# Patient Record
Sex: Male | Born: 1935 | Race: White | Hispanic: No | Marital: Married | Smoking: Former smoker
Health system: Southern US, Community
[De-identification: ages and names within clinical notes are randomized; demographics above are authoritative.]

## PROBLEM LIST (undated history)

## (undated) DIAGNOSIS — E7800 Pure hypercholesterolemia, unspecified: Secondary | ICD-10-CM

## (undated) DIAGNOSIS — K219 Gastro-esophageal reflux disease without esophagitis: Secondary | ICD-10-CM

## (undated) DIAGNOSIS — E663 Overweight: Secondary | ICD-10-CM

## (undated) DIAGNOSIS — R270 Ataxia, unspecified: Secondary | ICD-10-CM

## (undated) DIAGNOSIS — Z8546 Personal history of malignant neoplasm of prostate: Secondary | ICD-10-CM

## (undated) DIAGNOSIS — D638 Anemia in other chronic diseases classified elsewhere: Secondary | ICD-10-CM

## (undated) DIAGNOSIS — I10 Essential (primary) hypertension: Secondary | ICD-10-CM

## (undated) DIAGNOSIS — M1990 Unspecified osteoarthritis, unspecified site: Secondary | ICD-10-CM

## (undated) HISTORY — PX: CATARACT EXTRACTION: SUR2

## (undated) HISTORY — DX: Gastro-esophageal reflux disease without esophagitis: K21.9

## (undated) HISTORY — DX: Anemia in other chronic diseases classified elsewhere: D63.8

## (undated) HISTORY — DX: Personal history of malignant neoplasm of prostate: Z85.46

## (undated) HISTORY — DX: Pure hypercholesterolemia, unspecified: E78.00

## (undated) HISTORY — DX: Unspecified osteoarthritis, unspecified site: M19.90

## (undated) HISTORY — PX: PROSTATE SURGERY: SHX751

## (undated) HISTORY — DX: Essential (primary) hypertension: I10

## (undated) HISTORY — DX: Overweight: E66.3

## (undated) HISTORY — DX: Ataxia, unspecified: R27.0

---

## 1996-02-23 HISTORY — PX: TOTAL HIP ARTHROPLASTY: SHX124

## 2006-08-03 HISTORY — PX: INGUINAL HERNIA REPAIR: SUR1180

## 2007-05-11 ENCOUNTER — Inpatient Hospital Stay (HOSPITAL_COMMUNITY): Admission: RE | Admit: 2007-05-11 | Discharge: 2007-05-13 | Payer: Self-pay | Admitting: Orthopedic Surgery

## 2008-08-01 IMAGING — CR DG PORTABLE PELVIS
1 series · 1 of 1 positions shown · non-contrast
Comparison: None.

CLINICAL DATA: Status post right hip replacement.

PORTABLE PELVIS

[view not recorded]
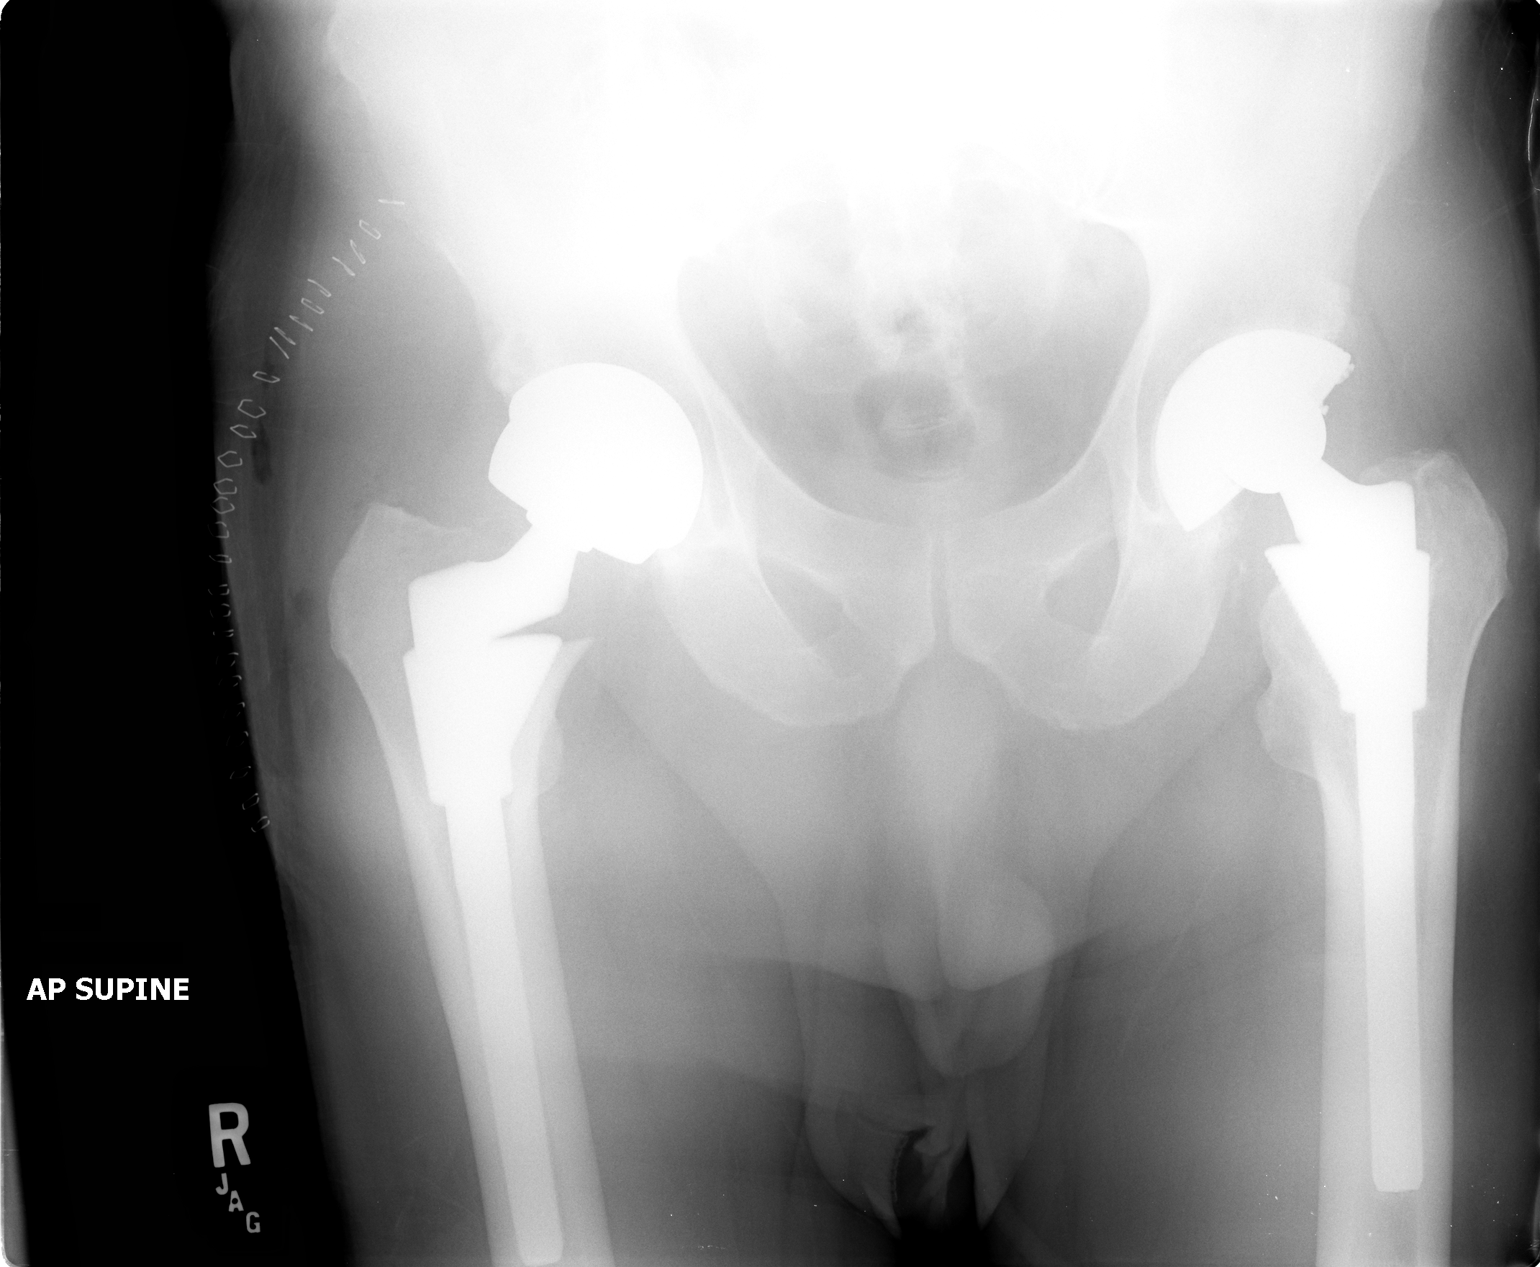

[1 of 1 positions shown; findings below may reference images not displayed]

FINDINGS: Status post bilateral hip replacements with the recent
hip replacement on the right.  This appears in satisfactory
position.  The tip is barely visualized on the present exam.
IMPRESSION: Status post bilateral hip replacement appearing in satisfactory
position.

## 2009-06-19 ENCOUNTER — Ambulatory Visit (HOSPITAL_BASED_OUTPATIENT_CLINIC_OR_DEPARTMENT_OTHER): Admission: RE | Admit: 2009-06-19 | Discharge: 2009-06-19 | Payer: Self-pay | Admitting: Urology

## 2009-07-09 ENCOUNTER — Ambulatory Visit: Admission: RE | Admit: 2009-07-09 | Discharge: 2009-10-16 | Payer: Self-pay | Admitting: Radiation Oncology

## 2009-11-27 ENCOUNTER — Ambulatory Visit: Admission: RE | Admit: 2009-11-27 | Discharge: 2009-12-02 | Payer: Self-pay | Admitting: Radiation Oncology

## 2010-04-07 LAB — GLUCOSE, CAPILLARY: Glucose-Capillary: 105 mg/dL — ABNORMAL HIGH (ref 70–99)

## 2010-06-03 NOTE — Op Note (Signed)
NAME:  Jack Turner, Jack Turner NO.:  [id number]   MEDICAL RECORD NO.:  [id number]          PATIENT TYPE:  INP   LOCATION:  5028                         FACILITY:  [facility]   PHYSICIAN:  Harvie Junior, M.D.   DATE OF BIRTH:  May 24, 1931   DATE OF PROCEDURE:  05/11/2007  DATE OF DISCHARGE:                               OPERATIVE REPORT   PREOPERATIVE DIAGNOSIS:  End-stage degenerative joint disease right hip.   POSTOPERATIVE DIAGNOSIS:  End-stage degenerative joint disease right  hip.   OPERATIONS AND PROCEDURE:  Right total hip replacement with a S-ROM  implant  18 x 13 stem, 36 +12 offset with a 18 F large cone with a 56  ASR cup and a 49 +3  ASR hip ball.   SURGEON:  Harvie Junior, M.D.   ASSISTANT:  Marshia Ly, P.A.   ANESTHESIA:  General.   BRIEF HISTORY:  Jack Turner is a 76 year old gentleman with a long  history of having had significant pain in his right hip.  We have worked  him up thoroughly related to his back, related to his hip.  We did an  intraarticular hip injection which helped quite a lot.  We did some  epidural steroids which helped but did not change his hip pain and  ultimately felt that we were satisfied that the problem was coming from  his hip and it has actually certainly showed end-stage degenerative  joint disease.  He was brought to the operating room for right total hip  replacement.   PROCEDURE:  The patient was brought to the operating room.  After  adequate anesthesia was obtained under general anesthetic, the patient  placed in the left lateral decubitus position.  All bony prominences  were well padded.  Attention was then turned to the right hip where  after routine prep and drape, the incision was made over the posterior  aspect of the hip, subcutaneous tissues taken at down the level of the  IT band.  The IT band was then divided as well as the gluteus maximus  __________ fractured.  A __________ retractor was put in place.   The hip  was then internally rotated and retractors were put in above the  piriformis and below the hip capsule and the piriformis and the capsule  and short external rotators were taken down and tagged.  Following this,  a labrectomy was performed and the retractor was put in place anterior  to the acetabulum and in the condyloid fossa.  At this point, the  acetabulum sequentially reamed to a level of 55 and a 56 cup was put in  place after trial on 54 and 56, 20 degrees of anteversion on the cup, 45  degrees of lateral opening and the cup was excellently placed at this  point.  Low anterior osteophyte needed to be removed at this point off  of the acetabulum, this was performed.  Following this, attention was  turned towards the stem, where the canal was found and then sequentially  reamed up to a level of 13.  A 13  __________  reamer was then advanced  three-quarters of the way down the femur.  Attention was then turned up  to the stem where an 87 F large cone with a large ball was chosen and  then this trial was put in place.  The hip was then reduced in neutral  version relative to the spout and this actually turned out to be almost  no version relative to the condylar axis and at this point, we felt that  dislocation was really ready.  At that point, we put 30 degrees of  anteversion relative to the condylar axis and then went back with a +0  ball, and actually felt pretty good.  We went to a +3 ball to try to  match his anatomy from his preoperative templating and x-rays and this  actually was a very stable hip.  At this point, the trials were removed.  The final F stem implant was put in place 36 +12 offset with 30 degrees  of anteversion relative to the cone which was about 30 degrees of  anteversion relative to the condylar axis.  The +3 hip ball was then  used 49 to match a 56 ASR cup and the trial reduction was undertaken and  this went excellently.  The hip was then put  through a range of motion,  a very stable hip at this point and the length appeared equal and  symmetric on the table.  At this point, the wound was copiously and  thoroughly irrigated.  Short external rotators, posterior capsule, and  piriformis were repaired to the intertrochanteric line posteriorly and  once this was completed, the wound was copiously and thoroughly  irrigated, and suctioned dry.  __________  fascia was then closed with 1  Vicryl, running subcu with 0 and 2-0 Vicryl, and skin with staples.  A  sterile compression dressing was applied.  The patient was taken to the  recovery room and was noted to be in satisfactory condition.   ESTIMATED BLOOD LOSS:  Less than 300 mL.   __________ .      Harvie Junior, M.D.  Electronically Signed     JLG/MEDQ  D:  05/11/2007  T:  05/12/2007  Job:  [id number]

## 2010-10-14 LAB — BASIC METABOLIC PANEL
BUN: 17
Chloride: 107
Sodium: 141

## 2010-10-14 LAB — TYPE AND SCREEN
ABO/RH(D): A POS
Antibody Screen: NEGATIVE

## 2010-10-14 LAB — PROTIME-INR
INR: 1
INR: 1.2
Prothrombin Time: 13.6
Prothrombin Time: 15.3 — ABNORMAL HIGH
Prothrombin Time: 21.7 — ABNORMAL HIGH

## 2010-10-14 LAB — URINALYSIS, ROUTINE W REFLEX MICROSCOPIC
Bilirubin Urine: NEGATIVE
Glucose, UA: NEGATIVE
Protein, ur: NEGATIVE
Specific Gravity, Urine: 1.018
Urobilinogen, UA: 0.2

## 2010-10-14 LAB — CBC
HCT: 41.3
Hemoglobin: 10 — ABNORMAL LOW
Hemoglobin: 10.8 — ABNORMAL LOW
Hemoglobin: 14.3
MCHC: 34.3
MCHC: 35.3
MCV: 92.2
MCV: 93
Platelets: 194
RBC: 3.14 — ABNORMAL LOW
RBC: 3.31 — ABNORMAL LOW
RBC: 4.44
RDW: 13.4
WBC: 12.1 — ABNORMAL HIGH
WBC: 8.8

## 2010-10-14 LAB — DIFFERENTIAL
Basophils Relative: 0
Eosinophils Relative: 2
Lymphocytes Relative: 14
Monocytes Absolute: 0.5
Monocytes Relative: 6
Neutro Abs: 6.8

## 2010-10-14 LAB — APTT: aPTT: 31

## 2010-10-14 LAB — URINE MICROSCOPIC-ADD ON

## 2010-10-14 LAB — COMPREHENSIVE METABOLIC PANEL
Albumin: 4.1
Calcium: 8.9
Chloride: 105
GFR calc non Af Amer: >60
Glucose, Bld: 99
Total Bilirubin: 0.8

## 2011-02-16 DIAGNOSIS — H4720 Unspecified optic atrophy: Secondary | ICD-10-CM | POA: Diagnosis not present

## 2011-02-16 DIAGNOSIS — H43819 Vitreous degeneration, unspecified eye: Secondary | ICD-10-CM | POA: Diagnosis not present

## 2011-02-16 DIAGNOSIS — H34239 Retinal artery branch occlusion, unspecified eye: Secondary | ICD-10-CM | POA: Diagnosis not present

## 2011-04-02 DIAGNOSIS — H524 Presbyopia: Secondary | ICD-10-CM | POA: Diagnosis not present

## 2011-04-02 DIAGNOSIS — H26499 Other secondary cataract, unspecified eye: Secondary | ICD-10-CM | POA: Diagnosis not present

## 2011-06-29 DIAGNOSIS — C61 Malignant neoplasm of prostate: Secondary | ICD-10-CM | POA: Diagnosis not present

## 2011-07-13 DIAGNOSIS — I10 Essential (primary) hypertension: Secondary | ICD-10-CM | POA: Diagnosis not present

## 2011-07-13 DIAGNOSIS — D2330 Other benign neoplasm of skin of unspecified part of face: Secondary | ICD-10-CM | POA: Diagnosis not present

## 2011-07-13 DIAGNOSIS — E7800 Pure hypercholesterolemia, unspecified: Secondary | ICD-10-CM | POA: Diagnosis not present

## 2011-09-10 DIAGNOSIS — R310 Gross hematuria: Secondary | ICD-10-CM | POA: Diagnosis not present

## 2011-09-10 DIAGNOSIS — C61 Malignant neoplasm of prostate: Secondary | ICD-10-CM | POA: Diagnosis not present

## 2011-09-16 DIAGNOSIS — R310 Gross hematuria: Secondary | ICD-10-CM | POA: Diagnosis not present

## 2011-09-16 DIAGNOSIS — Q618 Other cystic kidney diseases: Secondary | ICD-10-CM | POA: Diagnosis not present

## 2011-09-29 DIAGNOSIS — C61 Malignant neoplasm of prostate: Secondary | ICD-10-CM | POA: Diagnosis not present

## 2011-09-29 DIAGNOSIS — R310 Gross hematuria: Secondary | ICD-10-CM | POA: Diagnosis not present

## 2011-10-05 DIAGNOSIS — Z23 Encounter for immunization: Secondary | ICD-10-CM | POA: Diagnosis not present

## 2011-10-23 DIAGNOSIS — H33309 Unspecified retinal break, unspecified eye: Secondary | ICD-10-CM | POA: Diagnosis not present

## 2011-10-23 DIAGNOSIS — H4720 Unspecified optic atrophy: Secondary | ICD-10-CM | POA: Diagnosis not present

## 2011-10-23 DIAGNOSIS — H33319 Horseshoe tear of retina without detachment, unspecified eye: Secondary | ICD-10-CM | POA: Diagnosis not present

## 2011-10-26 DIAGNOSIS — M25559 Pain in unspecified hip: Secondary | ICD-10-CM | POA: Diagnosis not present

## 2011-10-26 DIAGNOSIS — M48061 Spinal stenosis, lumbar region without neurogenic claudication: Secondary | ICD-10-CM | POA: Diagnosis not present

## 2011-10-26 DIAGNOSIS — M545 Low back pain: Secondary | ICD-10-CM | POA: Diagnosis not present

## 2011-11-06 DIAGNOSIS — M47817 Spondylosis without myelopathy or radiculopathy, lumbosacral region: Secondary | ICD-10-CM | POA: Diagnosis not present

## 2011-11-12 DIAGNOSIS — M48061 Spinal stenosis, lumbar region without neurogenic claudication: Secondary | ICD-10-CM | POA: Diagnosis not present

## 2011-11-20 DIAGNOSIS — I10 Essential (primary) hypertension: Secondary | ICD-10-CM | POA: Diagnosis not present

## 2011-11-20 DIAGNOSIS — M542 Cervicalgia: Secondary | ICD-10-CM | POA: Diagnosis not present

## 2011-11-23 DIAGNOSIS — M48061 Spinal stenosis, lumbar region without neurogenic claudication: Secondary | ICD-10-CM | POA: Diagnosis not present

## 2011-12-08 DIAGNOSIS — M48061 Spinal stenosis, lumbar region without neurogenic claudication: Secondary | ICD-10-CM | POA: Diagnosis not present

## 2011-12-30 DIAGNOSIS — R351 Nocturia: Secondary | ICD-10-CM | POA: Diagnosis not present

## 2011-12-30 DIAGNOSIS — C61 Malignant neoplasm of prostate: Secondary | ICD-10-CM | POA: Diagnosis not present

## 2012-01-01 DIAGNOSIS — M48061 Spinal stenosis, lumbar region without neurogenic claudication: Secondary | ICD-10-CM | POA: Diagnosis not present

## 2012-02-11 DIAGNOSIS — E7800 Pure hypercholesterolemia, unspecified: Secondary | ICD-10-CM | POA: Diagnosis not present

## 2012-02-11 DIAGNOSIS — I10 Essential (primary) hypertension: Secondary | ICD-10-CM | POA: Diagnosis not present

## 2012-02-11 DIAGNOSIS — J45909 Unspecified asthma, uncomplicated: Secondary | ICD-10-CM | POA: Diagnosis not present

## 2012-02-11 DIAGNOSIS — D539 Nutritional anemia, unspecified: Secondary | ICD-10-CM | POA: Diagnosis not present

## 2012-03-24 DIAGNOSIS — M48061 Spinal stenosis, lumbar region without neurogenic claudication: Secondary | ICD-10-CM | POA: Diagnosis not present

## 2012-04-25 DIAGNOSIS — H4720 Unspecified optic atrophy: Secondary | ICD-10-CM | POA: Diagnosis not present

## 2012-04-25 DIAGNOSIS — H33309 Unspecified retinal break, unspecified eye: Secondary | ICD-10-CM | POA: Diagnosis not present

## 2012-04-25 DIAGNOSIS — H43819 Vitreous degeneration, unspecified eye: Secondary | ICD-10-CM | POA: Diagnosis not present

## 2012-07-11 DIAGNOSIS — C61 Malignant neoplasm of prostate: Secondary | ICD-10-CM | POA: Diagnosis not present

## 2012-07-11 DIAGNOSIS — N3289 Other specified disorders of bladder: Secondary | ICD-10-CM | POA: Diagnosis not present

## 2012-08-29 DIAGNOSIS — I10 Essential (primary) hypertension: Secondary | ICD-10-CM | POA: Diagnosis not present

## 2012-08-29 DIAGNOSIS — Z0000 Encounter for general adult medical examination without abnormal findings: Secondary | ICD-10-CM | POA: Diagnosis not present

## 2012-08-29 DIAGNOSIS — E7800 Pure hypercholesterolemia, unspecified: Secondary | ICD-10-CM | POA: Diagnosis not present

## 2012-10-11 DIAGNOSIS — Z23 Encounter for immunization: Secondary | ICD-10-CM | POA: Diagnosis not present

## 2012-10-24 DIAGNOSIS — H4011X0 Primary open-angle glaucoma, stage unspecified: Secondary | ICD-10-CM | POA: Diagnosis not present

## 2012-10-24 DIAGNOSIS — H4720 Unspecified optic atrophy: Secondary | ICD-10-CM | POA: Diagnosis not present

## 2012-10-24 DIAGNOSIS — H33319 Horseshoe tear of retina without detachment, unspecified eye: Secondary | ICD-10-CM | POA: Diagnosis not present

## 2012-10-24 DIAGNOSIS — H43819 Vitreous degeneration, unspecified eye: Secondary | ICD-10-CM | POA: Diagnosis not present

## 2012-10-24 DIAGNOSIS — H31009 Unspecified chorioretinal scars, unspecified eye: Secondary | ICD-10-CM | POA: Diagnosis not present

## 2012-12-22 DIAGNOSIS — IMO0002 Reserved for concepts with insufficient information to code with codable children: Secondary | ICD-10-CM | POA: Diagnosis not present

## 2013-01-09 DIAGNOSIS — N3289 Other specified disorders of bladder: Secondary | ICD-10-CM | POA: Diagnosis not present

## 2013-01-09 DIAGNOSIS — C61 Malignant neoplasm of prostate: Secondary | ICD-10-CM | POA: Diagnosis not present

## 2013-03-15 DIAGNOSIS — I10 Essential (primary) hypertension: Secondary | ICD-10-CM | POA: Diagnosis not present

## 2013-03-15 DIAGNOSIS — M1990 Unspecified osteoarthritis, unspecified site: Secondary | ICD-10-CM | POA: Diagnosis not present

## 2013-03-15 DIAGNOSIS — E7800 Pure hypercholesterolemia, unspecified: Secondary | ICD-10-CM | POA: Diagnosis not present

## 2013-03-15 DIAGNOSIS — J40 Bronchitis, not specified as acute or chronic: Secondary | ICD-10-CM | POA: Diagnosis not present

## 2013-03-22 DIAGNOSIS — N183 Chronic kidney disease, stage 3 unspecified: Secondary | ICD-10-CM | POA: Diagnosis not present

## 2013-04-24 DIAGNOSIS — H3410 Central retinal artery occlusion, unspecified eye: Secondary | ICD-10-CM | POA: Diagnosis not present

## 2013-04-24 DIAGNOSIS — H4011X0 Primary open-angle glaucoma, stage unspecified: Secondary | ICD-10-CM | POA: Diagnosis not present

## 2013-04-24 DIAGNOSIS — H43819 Vitreous degeneration, unspecified eye: Secondary | ICD-10-CM | POA: Diagnosis not present

## 2013-05-04 DIAGNOSIS — M47817 Spondylosis without myelopathy or radiculopathy, lumbosacral region: Secondary | ICD-10-CM | POA: Diagnosis not present

## 2013-08-02 DIAGNOSIS — D1739 Benign lipomatous neoplasm of skin and subcutaneous tissue of other sites: Secondary | ICD-10-CM | POA: Diagnosis not present

## 2013-08-02 DIAGNOSIS — T148 Other injury of unspecified body region: Secondary | ICD-10-CM | POA: Diagnosis not present

## 2013-08-15 DIAGNOSIS — C61 Malignant neoplasm of prostate: Secondary | ICD-10-CM | POA: Diagnosis not present

## 2013-08-31 DIAGNOSIS — M47817 Spondylosis without myelopathy or radiculopathy, lumbosacral region: Secondary | ICD-10-CM | POA: Diagnosis not present

## 2013-09-11 DIAGNOSIS — I10 Essential (primary) hypertension: Secondary | ICD-10-CM | POA: Diagnosis not present

## 2013-09-11 DIAGNOSIS — Z0000 Encounter for general adult medical examination without abnormal findings: Secondary | ICD-10-CM | POA: Diagnosis not present

## 2013-09-11 DIAGNOSIS — Z23 Encounter for immunization: Secondary | ICD-10-CM | POA: Diagnosis not present

## 2013-09-11 DIAGNOSIS — J45909 Unspecified asthma, uncomplicated: Secondary | ICD-10-CM | POA: Diagnosis not present

## 2013-10-11 DIAGNOSIS — Z23 Encounter for immunization: Secondary | ICD-10-CM | POA: Diagnosis not present

## 2013-10-23 DIAGNOSIS — H4011X2 Primary open-angle glaucoma, moderate stage: Secondary | ICD-10-CM | POA: Diagnosis not present

## 2013-10-23 DIAGNOSIS — H3412 Central retinal artery occlusion, left eye: Secondary | ICD-10-CM | POA: Diagnosis not present

## 2013-10-23 DIAGNOSIS — H4720 Unspecified optic atrophy: Secondary | ICD-10-CM | POA: Diagnosis not present

## 2014-02-15 DIAGNOSIS — N3289 Other specified disorders of bladder: Secondary | ICD-10-CM | POA: Diagnosis not present

## 2014-02-15 DIAGNOSIS — C61 Malignant neoplasm of prostate: Secondary | ICD-10-CM | POA: Diagnosis not present

## 2014-03-14 DIAGNOSIS — L84 Corns and callosities: Secondary | ICD-10-CM | POA: Diagnosis not present

## 2014-03-14 DIAGNOSIS — T485X1A Poisoning by other anti-common-cold drugs, accidental (unintentional), initial encounter: Secondary | ICD-10-CM | POA: Diagnosis not present

## 2014-03-14 DIAGNOSIS — J310 Chronic rhinitis: Secondary | ICD-10-CM | POA: Diagnosis not present

## 2014-03-14 DIAGNOSIS — J329 Chronic sinusitis, unspecified: Secondary | ICD-10-CM | POA: Diagnosis not present

## 2014-03-30 DIAGNOSIS — D509 Iron deficiency anemia, unspecified: Secondary | ICD-10-CM | POA: Diagnosis not present

## 2014-03-30 DIAGNOSIS — M1990 Unspecified osteoarthritis, unspecified site: Secondary | ICD-10-CM | POA: Diagnosis not present

## 2014-03-30 DIAGNOSIS — Z1389 Encounter for screening for other disorder: Secondary | ICD-10-CM | POA: Diagnosis not present

## 2014-03-30 DIAGNOSIS — Z79899 Other long term (current) drug therapy: Secondary | ICD-10-CM | POA: Diagnosis not present

## 2014-03-30 DIAGNOSIS — I10 Essential (primary) hypertension: Secondary | ICD-10-CM | POA: Diagnosis not present

## 2014-03-30 DIAGNOSIS — E780 Pure hypercholesterolemia: Secondary | ICD-10-CM | POA: Diagnosis not present

## 2014-03-30 DIAGNOSIS — J45909 Unspecified asthma, uncomplicated: Secondary | ICD-10-CM | POA: Diagnosis not present

## 2014-04-06 DIAGNOSIS — E049 Nontoxic goiter, unspecified: Secondary | ICD-10-CM | POA: Diagnosis not present

## 2014-04-06 DIAGNOSIS — E041 Nontoxic single thyroid nodule: Secondary | ICD-10-CM | POA: Diagnosis not present

## 2014-04-11 DIAGNOSIS — R899 Unspecified abnormal finding in specimens from other organs, systems and tissues: Secondary | ICD-10-CM | POA: Diagnosis not present

## 2014-04-11 DIAGNOSIS — I10 Essential (primary) hypertension: Secondary | ICD-10-CM | POA: Diagnosis not present

## 2014-05-31 DIAGNOSIS — M25551 Pain in right hip: Secondary | ICD-10-CM | POA: Diagnosis not present

## 2014-05-31 DIAGNOSIS — M25552 Pain in left hip: Secondary | ICD-10-CM | POA: Diagnosis not present

## 2014-05-31 DIAGNOSIS — M545 Low back pain: Secondary | ICD-10-CM | POA: Diagnosis not present

## 2014-06-15 DIAGNOSIS — J45909 Unspecified asthma, uncomplicated: Secondary | ICD-10-CM | POA: Diagnosis not present

## 2014-06-15 DIAGNOSIS — I10 Essential (primary) hypertension: Secondary | ICD-10-CM | POA: Diagnosis not present

## 2014-06-15 DIAGNOSIS — Z1389 Encounter for screening for other disorder: Secondary | ICD-10-CM | POA: Diagnosis not present

## 2014-06-15 DIAGNOSIS — K529 Noninfective gastroenteritis and colitis, unspecified: Secondary | ICD-10-CM | POA: Diagnosis not present

## 2014-06-28 DIAGNOSIS — M47816 Spondylosis without myelopathy or radiculopathy, lumbar region: Secondary | ICD-10-CM | POA: Diagnosis not present

## 2014-09-05 DIAGNOSIS — N3289 Other specified disorders of bladder: Secondary | ICD-10-CM | POA: Diagnosis not present

## 2014-09-05 DIAGNOSIS — C61 Malignant neoplasm of prostate: Secondary | ICD-10-CM | POA: Diagnosis not present

## 2014-10-01 DIAGNOSIS — H4720 Unspecified optic atrophy: Secondary | ICD-10-CM | POA: Diagnosis not present

## 2014-10-01 DIAGNOSIS — H3412 Central retinal artery occlusion, left eye: Secondary | ICD-10-CM | POA: Diagnosis not present

## 2014-10-01 DIAGNOSIS — H33313 Horseshoe tear of retina without detachment, bilateral: Secondary | ICD-10-CM | POA: Diagnosis not present

## 2014-10-01 DIAGNOSIS — H4011X2 Primary open-angle glaucoma, moderate stage: Secondary | ICD-10-CM | POA: Diagnosis not present

## 2014-10-08 DIAGNOSIS — Z23 Encounter for immunization: Secondary | ICD-10-CM | POA: Diagnosis not present

## 2014-10-22 DIAGNOSIS — H401130 Primary open-angle glaucoma, bilateral, stage unspecified: Secondary | ICD-10-CM | POA: Diagnosis not present

## 2014-10-22 DIAGNOSIS — H4720 Unspecified optic atrophy: Secondary | ICD-10-CM | POA: Diagnosis not present

## 2014-10-22 DIAGNOSIS — H02839 Dermatochalasis of unspecified eye, unspecified eyelid: Secondary | ICD-10-CM | POA: Diagnosis not present

## 2014-10-22 DIAGNOSIS — H4010X0 Unspecified open-angle glaucoma, stage unspecified: Secondary | ICD-10-CM | POA: Diagnosis not present

## 2014-10-22 DIAGNOSIS — H18411 Arcus senilis, right eye: Secondary | ICD-10-CM | POA: Diagnosis not present

## 2014-10-25 DIAGNOSIS — H4720 Unspecified optic atrophy: Secondary | ICD-10-CM | POA: Diagnosis not present

## 2014-10-25 DIAGNOSIS — J329 Chronic sinusitis, unspecified: Secondary | ICD-10-CM | POA: Diagnosis not present

## 2015-03-11 DIAGNOSIS — I10 Essential (primary) hypertension: Secondary | ICD-10-CM | POA: Diagnosis not present

## 2015-03-11 DIAGNOSIS — Z0000 Encounter for general adult medical examination without abnormal findings: Secondary | ICD-10-CM | POA: Diagnosis not present

## 2015-03-11 DIAGNOSIS — R938 Abnormal findings on diagnostic imaging of other specified body structures: Secondary | ICD-10-CM | POA: Diagnosis not present

## 2015-03-11 DIAGNOSIS — R202 Paresthesia of skin: Secondary | ICD-10-CM | POA: Diagnosis not present

## 2015-03-14 DIAGNOSIS — C61 Malignant neoplasm of prostate: Secondary | ICD-10-CM | POA: Diagnosis not present

## 2015-03-15 DIAGNOSIS — E041 Nontoxic single thyroid nodule: Secondary | ICD-10-CM | POA: Diagnosis not present

## 2015-04-02 DIAGNOSIS — Z9181 History of falling: Secondary | ICD-10-CM | POA: Diagnosis not present

## 2015-04-02 DIAGNOSIS — D510 Vitamin B12 deficiency anemia due to intrinsic factor deficiency: Secondary | ICD-10-CM | POA: Diagnosis not present

## 2015-04-02 DIAGNOSIS — E785 Hyperlipidemia, unspecified: Secondary | ICD-10-CM | POA: Diagnosis not present

## 2015-04-02 DIAGNOSIS — I10 Essential (primary) hypertension: Secondary | ICD-10-CM | POA: Diagnosis not present

## 2015-04-02 DIAGNOSIS — M1990 Unspecified osteoarthritis, unspecified site: Secondary | ICD-10-CM | POA: Diagnosis not present

## 2015-04-02 DIAGNOSIS — Z1389 Encounter for screening for other disorder: Secondary | ICD-10-CM | POA: Diagnosis not present

## 2015-04-02 DIAGNOSIS — Z79899 Other long term (current) drug therapy: Secondary | ICD-10-CM | POA: Diagnosis not present

## 2015-05-10 DIAGNOSIS — H527 Unspecified disorder of refraction: Secondary | ICD-10-CM | POA: Diagnosis not present

## 2015-05-10 DIAGNOSIS — H401130 Primary open-angle glaucoma, bilateral, stage unspecified: Secondary | ICD-10-CM | POA: Diagnosis not present

## 2015-05-10 DIAGNOSIS — Z961 Presence of intraocular lens: Secondary | ICD-10-CM | POA: Diagnosis not present

## 2015-05-10 DIAGNOSIS — H4720 Unspecified optic atrophy: Secondary | ICD-10-CM | POA: Diagnosis not present

## 2015-05-16 DIAGNOSIS — M47816 Spondylosis without myelopathy or radiculopathy, lumbar region: Secondary | ICD-10-CM | POA: Diagnosis not present

## 2015-06-06 DIAGNOSIS — M160 Bilateral primary osteoarthritis of hip: Secondary | ICD-10-CM | POA: Diagnosis not present

## 2015-09-20 ENCOUNTER — Encounter: Payer: Self-pay | Admitting: Orthopedic Surgery

## 2015-09-30 DIAGNOSIS — H401132 Primary open-angle glaucoma, bilateral, moderate stage: Secondary | ICD-10-CM | POA: Diagnosis not present

## 2015-09-30 DIAGNOSIS — H4720 Unspecified optic atrophy: Secondary | ICD-10-CM | POA: Diagnosis not present

## 2015-09-30 DIAGNOSIS — H3412 Central retinal artery occlusion, left eye: Secondary | ICD-10-CM | POA: Diagnosis not present

## 2015-09-30 DIAGNOSIS — H33313 Horseshoe tear of retina without detachment, bilateral: Secondary | ICD-10-CM | POA: Diagnosis not present

## 2015-10-03 DIAGNOSIS — N3289 Other specified disorders of bladder: Secondary | ICD-10-CM | POA: Diagnosis not present

## 2015-10-03 DIAGNOSIS — C61 Malignant neoplasm of prostate: Secondary | ICD-10-CM | POA: Diagnosis not present

## 2015-11-01 DIAGNOSIS — Z23 Encounter for immunization: Secondary | ICD-10-CM | POA: Diagnosis not present

## 2016-04-01 DIAGNOSIS — N3289 Other specified disorders of bladder: Secondary | ICD-10-CM | POA: Diagnosis not present

## 2016-04-01 DIAGNOSIS — C61 Malignant neoplasm of prostate: Secondary | ICD-10-CM | POA: Diagnosis not present

## 2016-04-14 DIAGNOSIS — Z0000 Encounter for general adult medical examination without abnormal findings: Secondary | ICD-10-CM | POA: Diagnosis not present

## 2016-04-14 DIAGNOSIS — Z79899 Other long term (current) drug therapy: Secondary | ICD-10-CM | POA: Diagnosis not present

## 2016-04-14 DIAGNOSIS — E785 Hyperlipidemia, unspecified: Secondary | ICD-10-CM | POA: Diagnosis not present

## 2016-04-14 DIAGNOSIS — I10 Essential (primary) hypertension: Secondary | ICD-10-CM | POA: Diagnosis not present

## 2016-04-14 DIAGNOSIS — Z1389 Encounter for screening for other disorder: Secondary | ICD-10-CM | POA: Diagnosis not present

## 2016-04-14 DIAGNOSIS — Z9181 History of falling: Secondary | ICD-10-CM | POA: Diagnosis not present

## 2016-04-14 DIAGNOSIS — D519 Vitamin B12 deficiency anemia, unspecified: Secondary | ICD-10-CM | POA: Diagnosis not present

## 2016-04-30 DIAGNOSIS — E041 Nontoxic single thyroid nodule: Secondary | ICD-10-CM | POA: Diagnosis not present

## 2016-05-07 DIAGNOSIS — M47816 Spondylosis without myelopathy or radiculopathy, lumbar region: Secondary | ICD-10-CM | POA: Diagnosis not present

## 2016-05-14 DIAGNOSIS — H4720 Unspecified optic atrophy: Secondary | ICD-10-CM | POA: Diagnosis not present

## 2016-05-14 DIAGNOSIS — H401131 Primary open-angle glaucoma, bilateral, mild stage: Secondary | ICD-10-CM | POA: Diagnosis not present

## 2016-05-14 DIAGNOSIS — Z961 Presence of intraocular lens: Secondary | ICD-10-CM | POA: Diagnosis not present

## 2016-09-28 DIAGNOSIS — H33301 Unspecified retinal break, right eye: Secondary | ICD-10-CM | POA: Diagnosis not present

## 2016-09-28 DIAGNOSIS — H3412 Central retinal artery occlusion, left eye: Secondary | ICD-10-CM | POA: Diagnosis not present

## 2016-09-28 DIAGNOSIS — H4720 Unspecified optic atrophy: Secondary | ICD-10-CM | POA: Diagnosis not present

## 2016-09-28 DIAGNOSIS — H43811 Vitreous degeneration, right eye: Secondary | ICD-10-CM | POA: Diagnosis not present

## 2016-09-28 DIAGNOSIS — H33313 Horseshoe tear of retina without detachment, bilateral: Secondary | ICD-10-CM | POA: Diagnosis not present

## 2016-09-30 DIAGNOSIS — Z23 Encounter for immunization: Secondary | ICD-10-CM | POA: Diagnosis not present

## 2016-10-05 DIAGNOSIS — C61 Malignant neoplasm of prostate: Secondary | ICD-10-CM | POA: Diagnosis not present

## 2016-11-05 DIAGNOSIS — Z79899 Other long term (current) drug therapy: Secondary | ICD-10-CM | POA: Diagnosis not present

## 2016-11-05 DIAGNOSIS — E785 Hyperlipidemia, unspecified: Secondary | ICD-10-CM | POA: Diagnosis not present

## 2016-11-05 DIAGNOSIS — R140 Abdominal distension (gaseous): Secondary | ICD-10-CM | POA: Diagnosis not present

## 2016-11-05 DIAGNOSIS — I10 Essential (primary) hypertension: Secondary | ICD-10-CM | POA: Diagnosis not present

## 2016-11-05 DIAGNOSIS — Z6827 Body mass index (BMI) 27.0-27.9, adult: Secondary | ICD-10-CM | POA: Diagnosis not present

## 2016-11-23 DIAGNOSIS — H401132 Primary open-angle glaucoma, bilateral, moderate stage: Secondary | ICD-10-CM | POA: Diagnosis not present

## 2016-12-30 DIAGNOSIS — Z6826 Body mass index (BMI) 26.0-26.9, adult: Secondary | ICD-10-CM | POA: Diagnosis not present

## 2016-12-30 DIAGNOSIS — K529 Noninfective gastroenteritis and colitis, unspecified: Secondary | ICD-10-CM | POA: Diagnosis not present

## 2016-12-31 DIAGNOSIS — K529 Noninfective gastroenteritis and colitis, unspecified: Secondary | ICD-10-CM | POA: Diagnosis not present

## 2017-01-05 DIAGNOSIS — K529 Noninfective gastroenteritis and colitis, unspecified: Secondary | ICD-10-CM | POA: Diagnosis not present

## 2017-04-05 DIAGNOSIS — C61 Malignant neoplasm of prostate: Secondary | ICD-10-CM | POA: Diagnosis not present

## 2017-04-05 DIAGNOSIS — R351 Nocturia: Secondary | ICD-10-CM | POA: Diagnosis not present

## 2017-04-29 DIAGNOSIS — I10 Essential (primary) hypertension: Secondary | ICD-10-CM | POA: Diagnosis not present

## 2017-04-29 DIAGNOSIS — Z1331 Encounter for screening for depression: Secondary | ICD-10-CM | POA: Diagnosis not present

## 2017-04-29 DIAGNOSIS — R5383 Other fatigue: Secondary | ICD-10-CM | POA: Diagnosis not present

## 2017-04-29 DIAGNOSIS — E785 Hyperlipidemia, unspecified: Secondary | ICD-10-CM | POA: Diagnosis not present

## 2017-04-29 DIAGNOSIS — Z79899 Other long term (current) drug therapy: Secondary | ICD-10-CM | POA: Diagnosis not present

## 2017-04-29 DIAGNOSIS — Z0000 Encounter for general adult medical examination without abnormal findings: Secondary | ICD-10-CM | POA: Diagnosis not present

## 2017-04-29 DIAGNOSIS — Z6827 Body mass index (BMI) 27.0-27.9, adult: Secondary | ICD-10-CM | POA: Diagnosis not present

## 2017-04-29 DIAGNOSIS — Z9181 History of falling: Secondary | ICD-10-CM | POA: Diagnosis not present

## 2017-05-06 DIAGNOSIS — E876 Hypokalemia: Secondary | ICD-10-CM | POA: Diagnosis not present

## 2017-05-13 DIAGNOSIS — M47816 Spondylosis without myelopathy or radiculopathy, lumbar region: Secondary | ICD-10-CM | POA: Diagnosis not present

## 2017-05-24 DIAGNOSIS — Z96642 Presence of left artificial hip joint: Secondary | ICD-10-CM | POA: Diagnosis not present

## 2017-05-24 DIAGNOSIS — M25551 Pain in right hip: Secondary | ICD-10-CM | POA: Diagnosis not present

## 2017-05-24 DIAGNOSIS — M25552 Pain in left hip: Secondary | ICD-10-CM | POA: Diagnosis not present

## 2017-05-24 DIAGNOSIS — Z96641 Presence of right artificial hip joint: Secondary | ICD-10-CM | POA: Diagnosis not present

## 2017-05-31 DIAGNOSIS — H401132 Primary open-angle glaucoma, bilateral, moderate stage: Secondary | ICD-10-CM | POA: Diagnosis not present

## 2017-06-11 DIAGNOSIS — H9190 Unspecified hearing loss, unspecified ear: Secondary | ICD-10-CM | POA: Diagnosis not present

## 2017-06-11 DIAGNOSIS — H6123 Impacted cerumen, bilateral: Secondary | ICD-10-CM | POA: Diagnosis not present

## 2017-06-11 DIAGNOSIS — H9319 Tinnitus, unspecified ear: Secondary | ICD-10-CM | POA: Diagnosis not present

## 2017-06-11 DIAGNOSIS — Z77122 Contact with and (suspected) exposure to noise: Secondary | ICD-10-CM | POA: Diagnosis not present

## 2017-06-11 DIAGNOSIS — J342 Deviated nasal septum: Secondary | ICD-10-CM | POA: Diagnosis not present

## 2017-06-25 DIAGNOSIS — H903 Sensorineural hearing loss, bilateral: Secondary | ICD-10-CM | POA: Diagnosis not present

## 2017-06-25 DIAGNOSIS — H93299 Other abnormal auditory perceptions, unspecified ear: Secondary | ICD-10-CM | POA: Diagnosis not present

## 2017-06-25 DIAGNOSIS — J342 Deviated nasal septum: Secondary | ICD-10-CM | POA: Diagnosis not present

## 2017-06-25 DIAGNOSIS — H9319 Tinnitus, unspecified ear: Secondary | ICD-10-CM | POA: Diagnosis not present

## 2017-10-04 DIAGNOSIS — H33313 Horseshoe tear of retina without detachment, bilateral: Secondary | ICD-10-CM | POA: Diagnosis not present

## 2017-10-04 DIAGNOSIS — H3412 Central retinal artery occlusion, left eye: Secondary | ICD-10-CM | POA: Diagnosis not present

## 2017-10-04 DIAGNOSIS — H33301 Unspecified retinal break, right eye: Secondary | ICD-10-CM | POA: Diagnosis not present

## 2017-10-04 DIAGNOSIS — H4720 Unspecified optic atrophy: Secondary | ICD-10-CM | POA: Diagnosis not present

## 2017-10-06 DIAGNOSIS — R351 Nocturia: Secondary | ICD-10-CM | POA: Diagnosis not present

## 2017-10-06 DIAGNOSIS — C61 Malignant neoplasm of prostate: Secondary | ICD-10-CM | POA: Diagnosis not present

## 2017-10-19 DIAGNOSIS — H6123 Impacted cerumen, bilateral: Secondary | ICD-10-CM | POA: Diagnosis not present

## 2017-10-27 DIAGNOSIS — Z23 Encounter for immunization: Secondary | ICD-10-CM | POA: Diagnosis not present

## 2017-12-06 DIAGNOSIS — H18413 Arcus senilis, bilateral: Secondary | ICD-10-CM | POA: Diagnosis not present

## 2017-12-06 DIAGNOSIS — Z961 Presence of intraocular lens: Secondary | ICD-10-CM | POA: Diagnosis not present

## 2017-12-06 DIAGNOSIS — H02839 Dermatochalasis of unspecified eye, unspecified eyelid: Secondary | ICD-10-CM | POA: Diagnosis not present

## 2017-12-06 DIAGNOSIS — H401133 Primary open-angle glaucoma, bilateral, severe stage: Secondary | ICD-10-CM | POA: Diagnosis not present

## 2018-03-08 DIAGNOSIS — H6121 Impacted cerumen, right ear: Secondary | ICD-10-CM | POA: Diagnosis not present

## 2018-04-21 DIAGNOSIS — I10 Essential (primary) hypertension: Secondary | ICD-10-CM | POA: Diagnosis not present

## 2018-04-21 DIAGNOSIS — M1990 Unspecified osteoarthritis, unspecified site: Secondary | ICD-10-CM | POA: Diagnosis not present

## 2018-04-21 DIAGNOSIS — E785 Hyperlipidemia, unspecified: Secondary | ICD-10-CM | POA: Diagnosis not present

## 2018-04-21 DIAGNOSIS — R270 Ataxia, unspecified: Secondary | ICD-10-CM | POA: Diagnosis not present

## 2018-05-24 DIAGNOSIS — E785 Hyperlipidemia, unspecified: Secondary | ICD-10-CM | POA: Diagnosis not present

## 2018-05-24 DIAGNOSIS — Z79899 Other long term (current) drug therapy: Secondary | ICD-10-CM | POA: Diagnosis not present

## 2018-06-07 DIAGNOSIS — N3289 Other specified disorders of bladder: Secondary | ICD-10-CM | POA: Diagnosis not present

## 2018-06-07 DIAGNOSIS — C61 Malignant neoplasm of prostate: Secondary | ICD-10-CM | POA: Diagnosis not present

## 2018-06-20 DIAGNOSIS — H401133 Primary open-angle glaucoma, bilateral, severe stage: Secondary | ICD-10-CM | POA: Diagnosis not present

## 2018-06-20 DIAGNOSIS — Z961 Presence of intraocular lens: Secondary | ICD-10-CM | POA: Diagnosis not present

## 2018-06-20 DIAGNOSIS — H18413 Arcus senilis, bilateral: Secondary | ICD-10-CM | POA: Diagnosis not present

## 2018-06-27 DIAGNOSIS — M545 Low back pain: Secondary | ICD-10-CM | POA: Diagnosis not present

## 2018-06-27 DIAGNOSIS — M6530 Trigger finger, unspecified finger: Secondary | ICD-10-CM | POA: Diagnosis not present

## 2018-06-27 DIAGNOSIS — M25551 Pain in right hip: Secondary | ICD-10-CM | POA: Diagnosis not present

## 2018-06-27 DIAGNOSIS — M25552 Pain in left hip: Secondary | ICD-10-CM | POA: Diagnosis not present

## 2018-09-08 DIAGNOSIS — H6123 Impacted cerumen, bilateral: Secondary | ICD-10-CM | POA: Diagnosis not present

## 2018-09-08 DIAGNOSIS — H9193 Unspecified hearing loss, bilateral: Secondary | ICD-10-CM | POA: Diagnosis not present

## 2018-09-08 DIAGNOSIS — H61303 Acquired stenosis of external ear canal, unspecified, bilateral: Secondary | ICD-10-CM | POA: Diagnosis not present

## 2018-09-24 DIAGNOSIS — I10 Essential (primary) hypertension: Secondary | ICD-10-CM | POA: Diagnosis not present

## 2018-09-24 DIAGNOSIS — D649 Anemia, unspecified: Secondary | ICD-10-CM | POA: Diagnosis not present

## 2018-09-24 DIAGNOSIS — R42 Dizziness and giddiness: Secondary | ICD-10-CM | POA: Diagnosis not present

## 2018-09-28 DIAGNOSIS — I10 Essential (primary) hypertension: Secondary | ICD-10-CM | POA: Diagnosis not present

## 2018-09-28 DIAGNOSIS — Z6827 Body mass index (BMI) 27.0-27.9, adult: Secondary | ICD-10-CM | POA: Diagnosis not present

## 2018-09-28 DIAGNOSIS — J45909 Unspecified asthma, uncomplicated: Secondary | ICD-10-CM | POA: Diagnosis not present

## 2018-09-28 DIAGNOSIS — Z0000 Encounter for general adult medical examination without abnormal findings: Secondary | ICD-10-CM | POA: Diagnosis not present

## 2018-09-28 DIAGNOSIS — Z23 Encounter for immunization: Secondary | ICD-10-CM | POA: Diagnosis not present

## 2018-09-28 DIAGNOSIS — Z1331 Encounter for screening for depression: Secondary | ICD-10-CM | POA: Diagnosis not present

## 2018-12-08 DIAGNOSIS — N3289 Other specified disorders of bladder: Secondary | ICD-10-CM | POA: Diagnosis not present

## 2018-12-08 DIAGNOSIS — C61 Malignant neoplasm of prostate: Secondary | ICD-10-CM | POA: Diagnosis not present

## 2018-12-26 DIAGNOSIS — Z1331 Encounter for screening for depression: Secondary | ICD-10-CM | POA: Diagnosis not present

## 2018-12-26 DIAGNOSIS — Z6826 Body mass index (BMI) 26.0-26.9, adult: Secondary | ICD-10-CM | POA: Diagnosis not present

## 2018-12-26 DIAGNOSIS — Z9181 History of falling: Secondary | ICD-10-CM | POA: Diagnosis not present

## 2018-12-26 DIAGNOSIS — I10 Essential (primary) hypertension: Secondary | ICD-10-CM | POA: Diagnosis not present

## 2019-03-16 DIAGNOSIS — H61303 Acquired stenosis of external ear canal, unspecified, bilateral: Secondary | ICD-10-CM | POA: Diagnosis not present

## 2019-03-16 DIAGNOSIS — H6123 Impacted cerumen, bilateral: Secondary | ICD-10-CM | POA: Diagnosis not present

## 2019-03-16 DIAGNOSIS — H9193 Unspecified hearing loss, bilateral: Secondary | ICD-10-CM | POA: Diagnosis not present

## 2019-04-19 DIAGNOSIS — E7800 Pure hypercholesterolemia, unspecified: Secondary | ICD-10-CM | POA: Diagnosis not present

## 2019-04-19 DIAGNOSIS — I10 Essential (primary) hypertension: Secondary | ICD-10-CM | POA: Diagnosis not present

## 2019-05-15 DIAGNOSIS — H18413 Arcus senilis, bilateral: Secondary | ICD-10-CM | POA: Diagnosis not present

## 2019-05-15 DIAGNOSIS — H401133 Primary open-angle glaucoma, bilateral, severe stage: Secondary | ICD-10-CM | POA: Diagnosis not present

## 2019-06-07 DIAGNOSIS — C61 Malignant neoplasm of prostate: Secondary | ICD-10-CM | POA: Diagnosis not present

## 2019-06-07 DIAGNOSIS — N3289 Other specified disorders of bladder: Secondary | ICD-10-CM | POA: Diagnosis not present

## 2019-07-19 DIAGNOSIS — E785 Hyperlipidemia, unspecified: Secondary | ICD-10-CM | POA: Diagnosis not present

## 2019-07-19 DIAGNOSIS — M1990 Unspecified osteoarthritis, unspecified site: Secondary | ICD-10-CM | POA: Diagnosis not present

## 2019-07-19 DIAGNOSIS — I10 Essential (primary) hypertension: Secondary | ICD-10-CM | POA: Diagnosis not present

## 2019-09-12 DIAGNOSIS — Z79899 Other long term (current) drug therapy: Secondary | ICD-10-CM | POA: Diagnosis not present

## 2019-09-12 DIAGNOSIS — M949 Disorder of cartilage, unspecified: Secondary | ICD-10-CM | POA: Diagnosis not present

## 2019-09-12 DIAGNOSIS — E7800 Pure hypercholesterolemia, unspecified: Secondary | ICD-10-CM | POA: Diagnosis not present

## 2019-09-14 DIAGNOSIS — H6123 Impacted cerumen, bilateral: Secondary | ICD-10-CM | POA: Diagnosis not present

## 2019-09-14 DIAGNOSIS — Z974 Presence of external hearing-aid: Secondary | ICD-10-CM | POA: Diagnosis not present

## 2019-09-14 DIAGNOSIS — H9193 Unspecified hearing loss, bilateral: Secondary | ICD-10-CM | POA: Diagnosis not present

## 2019-09-14 DIAGNOSIS — H61303 Acquired stenosis of external ear canal, unspecified, bilateral: Secondary | ICD-10-CM | POA: Diagnosis not present

## 2019-09-19 DIAGNOSIS — I10 Essential (primary) hypertension: Secondary | ICD-10-CM | POA: Diagnosis not present

## 2019-09-19 DIAGNOSIS — E7800 Pure hypercholesterolemia, unspecified: Secondary | ICD-10-CM | POA: Diagnosis not present

## 2019-10-17 DIAGNOSIS — Z23 Encounter for immunization: Secondary | ICD-10-CM | POA: Diagnosis not present

## 2019-10-19 DIAGNOSIS — E7800 Pure hypercholesterolemia, unspecified: Secondary | ICD-10-CM | POA: Diagnosis not present

## 2019-10-19 DIAGNOSIS — I10 Essential (primary) hypertension: Secondary | ICD-10-CM | POA: Diagnosis not present

## 2020-01-02 DIAGNOSIS — Z6825 Body mass index (BMI) 25.0-25.9, adult: Secondary | ICD-10-CM | POA: Diagnosis not present

## 2020-01-02 DIAGNOSIS — Z13820 Encounter for screening for osteoporosis: Secondary | ICD-10-CM | POA: Diagnosis not present

## 2020-01-02 DIAGNOSIS — Z1331 Encounter for screening for depression: Secondary | ICD-10-CM | POA: Diagnosis not present

## 2020-01-02 DIAGNOSIS — Z0000 Encounter for general adult medical examination without abnormal findings: Secondary | ICD-10-CM | POA: Diagnosis not present

## 2020-01-02 DIAGNOSIS — Z8546 Personal history of malignant neoplasm of prostate: Secondary | ICD-10-CM | POA: Diagnosis not present

## 2020-01-02 DIAGNOSIS — I10 Essential (primary) hypertension: Secondary | ICD-10-CM | POA: Diagnosis not present

## 2020-01-31 DIAGNOSIS — C61 Malignant neoplasm of prostate: Secondary | ICD-10-CM | POA: Diagnosis not present

## 2020-01-31 DIAGNOSIS — N3289 Other specified disorders of bladder: Secondary | ICD-10-CM | POA: Diagnosis not present

## 2020-03-14 DIAGNOSIS — H61303 Acquired stenosis of external ear canal, unspecified, bilateral: Secondary | ICD-10-CM | POA: Diagnosis not present

## 2020-03-14 DIAGNOSIS — Z974 Presence of external hearing-aid: Secondary | ICD-10-CM | POA: Diagnosis not present

## 2020-03-14 DIAGNOSIS — H9193 Unspecified hearing loss, bilateral: Secondary | ICD-10-CM | POA: Diagnosis not present

## 2020-03-14 DIAGNOSIS — H6123 Impacted cerumen, bilateral: Secondary | ICD-10-CM | POA: Diagnosis not present

## 2020-04-18 DIAGNOSIS — D485 Neoplasm of uncertain behavior of skin: Secondary | ICD-10-CM | POA: Diagnosis not present

## 2020-04-18 DIAGNOSIS — L821 Other seborrheic keratosis: Secondary | ICD-10-CM | POA: Diagnosis not present

## 2020-05-13 DIAGNOSIS — H401133 Primary open-angle glaucoma, bilateral, severe stage: Secondary | ICD-10-CM | POA: Diagnosis not present

## 2020-05-13 DIAGNOSIS — H18413 Arcus senilis, bilateral: Secondary | ICD-10-CM | POA: Diagnosis not present

## 2020-05-17 DIAGNOSIS — L259 Unspecified contact dermatitis, unspecified cause: Secondary | ICD-10-CM | POA: Diagnosis not present

## 2020-05-17 DIAGNOSIS — Z6826 Body mass index (BMI) 26.0-26.9, adult: Secondary | ICD-10-CM | POA: Diagnosis not present

## 2020-05-17 DIAGNOSIS — L03119 Cellulitis of unspecified part of limb: Secondary | ICD-10-CM | POA: Diagnosis not present

## 2020-06-27 DIAGNOSIS — N390 Urinary tract infection, site not specified: Secondary | ICD-10-CM | POA: Diagnosis not present

## 2020-07-16 DIAGNOSIS — Z6825 Body mass index (BMI) 25.0-25.9, adult: Secondary | ICD-10-CM | POA: Diagnosis not present

## 2020-07-16 DIAGNOSIS — E7800 Pure hypercholesterolemia, unspecified: Secondary | ICD-10-CM | POA: Diagnosis not present

## 2020-07-16 DIAGNOSIS — I10 Essential (primary) hypertension: Secondary | ICD-10-CM | POA: Diagnosis not present

## 2020-07-16 DIAGNOSIS — I499 Cardiac arrhythmia, unspecified: Secondary | ICD-10-CM | POA: Diagnosis not present

## 2020-07-17 ENCOUNTER — Encounter: Payer: Self-pay | Admitting: Cardiology

## 2020-07-18 DIAGNOSIS — E7800 Pure hypercholesterolemia, unspecified: Secondary | ICD-10-CM | POA: Diagnosis not present

## 2020-07-18 DIAGNOSIS — I10 Essential (primary) hypertension: Secondary | ICD-10-CM | POA: Diagnosis not present

## 2020-07-30 DIAGNOSIS — N3289 Other specified disorders of bladder: Secondary | ICD-10-CM | POA: Diagnosis not present

## 2020-07-30 DIAGNOSIS — C61 Malignant neoplasm of prostate: Secondary | ICD-10-CM | POA: Diagnosis not present

## 2020-08-07 ENCOUNTER — Ambulatory Visit: Payer: Medicare Other | Admitting: Cardiology

## 2020-09-09 DIAGNOSIS — H61303 Acquired stenosis of external ear canal, unspecified, bilateral: Secondary | ICD-10-CM | POA: Diagnosis not present

## 2020-09-09 DIAGNOSIS — Z974 Presence of external hearing-aid: Secondary | ICD-10-CM | POA: Diagnosis not present

## 2020-09-09 DIAGNOSIS — H6123 Impacted cerumen, bilateral: Secondary | ICD-10-CM | POA: Diagnosis not present

## 2020-09-09 DIAGNOSIS — H9193 Unspecified hearing loss, bilateral: Secondary | ICD-10-CM | POA: Diagnosis not present

## 2020-09-25 NOTE — Progress Notes (Deleted)
Cardiology Office Note:    Date:  09/25/2020   ID:  Jack Turner, DOB 07/23/1931, MRN [id number]  PCP:  Angelina Sheriff, MD  Cardiologist:  Shirlee More, MD   Referring MD: Angelina Sheriff, MD  ASSESSMENT:    No diagnosis found. PLAN:    In order of problems listed above:  ***  Next appointment   Medication Adjustments/Labs and Tests Ordered: Current medicines are reviewed at length with the patient today.  Concerns regarding medicines are outlined above.  No orders of the defined types were placed in this encounter.  No orders of the defined types were placed in this encounter.    No chief complaint on file. ***  History of Present Illness:    Jack Turner is a 87 y.o. male who is being seen today for the evaluation of *** at the request of [place], [name], MD. Chart review shows no previous cardiology evaluation or cardiac diagnostic testing performed.  Past Medical History:  Diagnosis Date   Anemia of chronic disorder    Arthritis, degenerative    Ataxia    GERD (gastroesophageal reflux disease)    History of prostate cancer    Hypercholesterolemia    Hypertension    Overweight     Past Surgical History:  Procedure Laterality Date   CATARACT EXTRACTION Right    INGUINAL HERNIA REPAIR Right 08/03/2006   Performed by Dr. Sherald Hess at [place] Bilateral 02/23/1996    Current Medications: No outpatient medications have been marked as taking for the 09/26/20 encounter (Appointment) with Richardo Priest, MD.     Allergies:   Hydrochlorothiazide   Social History   Socioeconomic History   Marital status: Married    Spouse name: Not on file   Number of children: Not on file   Years of education: Not on file   Highest education level: Not on file  Occupational History   Not on file  Tobacco Use   Smoking status: Former    Types: Cigarettes    Quit date: 45    Years since quitting: 30.7    Smokeless tobacco: Not on file  Substance and Sexual Activity   Alcohol use: Yes    Comment: 2-3 per week   Drug use: Not on file   Sexual activity: Not on file  Other Topics Concern   Not on file  Social History Narrative   Not on file   Social Determinants of Health   Financial Resource Strain: Not on file  Food Insecurity: Not on file  Transportation Needs: Not on file  Physical Activity: Not on file  Stress: Not on file  Social Connections: Not on file     Family History: The patient's ***family history includes Other in his brother.  ROS:   ROS Please see the history of present illness.    *** All other systems reviewed and are negative.  EKGs/Labs/Other Studies Reviewed:    The following studies were reviewed today: ***  EKG:  EKG is *** ordered today.  The ekg ordered today is personally reviewed and demonstrates ***  Recent Labs: No results found for requested labs within last 8760 hours.  Recent Lipid Panel No results found for: CHOL, TRIG, HDL, CHOLHDL, VLDL, LDLCALC, LDLDIRECT  Physical Exam:    VS:  There were no vitals taken for this visit.    Wt Readings from Last 3 Encounters:  07/16/20 165  lb (74.8 kg)     GEN: *** Well nourished, well developed in no acute distress HEENT: Normal NECK: No JVD; No carotid bruits LYMPHATICS: No lymphadenopathy CARDIAC: ***RRR, no murmurs, rubs, gallops RESPIRATORY:  Clear to auscultation without rales, wheezing or rhonchi  ABDOMEN: Soft, non-tender, non-distended MUSCULOSKELETAL:  No edema; No deformity  SKIN: Warm and dry NEUROLOGIC:  Alert and oriented x 3 PSYCHIATRIC:  Normal affect     Signed, Shirlee More, MD  09/25/2020 11:57 AM    [place]

## 2020-09-26 ENCOUNTER — Other Ambulatory Visit: Payer: Self-pay

## 2020-09-26 ENCOUNTER — Encounter: Payer: Self-pay | Admitting: Cardiology

## 2020-09-26 ENCOUNTER — Ambulatory Visit (INDEPENDENT_AMBULATORY_CARE_PROVIDER_SITE_OTHER): Payer: Medicare Other

## 2020-09-26 ENCOUNTER — Ambulatory Visit (INDEPENDENT_AMBULATORY_CARE_PROVIDER_SITE_OTHER): Payer: Medicare Other | Admitting: Cardiology

## 2020-09-26 VITALS — BP 180/80 | HR 89 | Ht 67.0 in | Wt 166.0 lb

## 2020-09-26 DIAGNOSIS — R609 Edema, unspecified: Secondary | ICD-10-CM | POA: Diagnosis not present

## 2020-09-26 DIAGNOSIS — R002 Palpitations: Secondary | ICD-10-CM | POA: Diagnosis not present

## 2020-09-26 DIAGNOSIS — I10 Essential (primary) hypertension: Secondary | ICD-10-CM | POA: Diagnosis not present

## 2020-09-26 MED ORDER — VALSARTAN 160 MG PO TABS: 160.0000 mg | ORAL_TABLET | Freq: Every day | ORAL | 3 refills | Status: DC

## 2020-09-26 NOTE — Progress Notes (Signed)
Cardiology Office Note:    Date:  09/26/2020   ID:  Jack Turner, DOB Jul 13, 1931, MRN [id number]  PCP:  Jack Sheriff, MD  Cardiologist:  Jack More, MD   Referring MD: Jack Sheriff, MD  ASSESSMENT:    1. Palpitations   2. Edema, unspecified type   3. Primary hypertension   4. Hyperlipidemia, unspecified hyperlipidemia type    PLAN:    In order of problems listed above:  Although the digital blood pressure alarm can be nonspecific and can also be clues to the presence of atrial fibrillation he has had palpitations at risk for atrial fibrillation utilize an event monitor if documented would need anticoagulant therapy. Likely due to increased dose of amlodipine but at risk for cardiomyopathy with abnormal EKG echocardiogram ordered to look at systolic diastolic function pulmonary pressure Discontinue calcium channel blocker continue his thiazide diuretic add ARB trend blood pressures for 2 weeks bring a list to the office and recheck renal function at that time  Next appointment 3 months or sooner depending on the results of testing   Medication Adjustments/Labs and Tests Ordered: Current medicines are reviewed at length with the patient today.  Concerns regarding medicines are outlined above.  Orders Placed This Encounter  Procedures   LONG TERM MONITOR (3-14 DAYS)   EKG 12-Lead   ECHOCARDIOGRAM COMPLETE   Meds ordered this encounter  Medications   valsartan (DIOVAN) 160 MG tablet    Sig: Take 1 tablet (160 mg total) by mouth daily.    Dispense:  90 tablet    Refill:  3     Chief Complaint  Patient presents with   Palpitations    History of Present Illness:   Jack Turner is a 87 y.o. male who is being seen today for the evaluation of palpitation concerning irregular heart rhythm from signal his digital blood pressure cuff and increasing lower extremity edema with exercise intolerance at the request of Jack Sheriff, MD.  He was seen by his  primary care physician service 07/16/2020 with complaints of palpitation.  Heart rate was 80 bpm blood pressure 165/93.  His past history is noteworthy for hyperlipidemia and hypertension.  He had an office EKG performed that I independently reviewed from September 2020 showing sinus rhythm otherwise unremarkable.  Recent labs 07/17/2018 hemoglobin 12.0 platelets 306,000 creatinine 1.1 GFR greater than 60 cc sodium 141 potassium 3.6 lipid profile with cholesterol 176 LDL 114 triglycerides 133 HDL 35.   He has a longstanding history of hypertension for decades recently his dose of calcium channel blocker was increased checking blood pressure at home have alarms on the digital device that he had irregular heart rhythm.  He was aware of extra beats and pauses at that time.  The symptoms have improved.  The increased dose of amlodipine is developed.  Tense lower extremity edema he finds himself difficulty ambulating with fatigue but no shortness of breath orthopnea chest pain or syncope.  He has no known history of congenital rheumatic heart disease or atrial fibrillation. Past Medical History:  Diagnosis Date   Anemia of chronic disorder    Arthritis, degenerative    Ataxia    GERD (gastroesophageal reflux disease)    History of prostate cancer    Hypercholesterolemia    Hypertension    Overweight     Past Surgical History:  Procedure Laterality Date   CATARACT EXTRACTION Right    INGUINAL HERNIA REPAIR Right 08/03/2006   Performed by  Dr. Sherald Hess at [place] Bilateral 02/23/1996    Current Medications: Current Meds  Medication Sig   ALPHAGAN P 0.1 % SOLN Place 1 drop into the left eye 2 (two) times daily.   amLODipine (NORVASC) 10 MG tablet Take 10 mg by mouth daily.   b complex vitamins capsule Take 1 capsule by mouth every other day.   Calcium Carbonate-Vitamin D (CALCIUM-VITAMIN D) 600-125 MG-UNIT TABS Take 1 tablet by mouth daily.    dorzolamide (TRUSOPT) 2 % ophthalmic solution Place 1 drop into the left eye 2 (two) times daily.   hydrochlorothiazide (HYDRODIURIL) 25 MG tablet Take 25 mg by mouth in the morning.   latanoprost (XALATAN) 0.005 % ophthalmic solution Place 1 drop into both eyes 2 (two) times daily.   Misc Natural Products (URINOZINC PO) Take 1 tablet by mouth daily. Strength unknown   Multiple Vitamin (MULTIVITAMIN) tablet Take 1 tablet by mouth daily.   Multiple Vitamins-Minerals (EYE HEALTH PO) Take 1 tablet by mouth daily.   omeprazole (PRILOSEC) 20 MG capsule Take 20 mg by mouth every morning.   SYMBICORT 160-4.5 MCG/ACT inhaler Inhale 2 puffs into the lungs 2 (two) times daily.   valsartan (DIOVAN) 160 MG tablet Take 1 tablet (160 mg total) by mouth daily.     Allergies:   Hydrochlorothiazide   Social History   Socioeconomic History   Marital status: Married    Spouse name: Not on file   Number of children: Not on file   Years of education: Not on file   Highest education level: Not on file  Occupational History   Not on file  Tobacco Use   Smoking status: Former    Types: Cigarettes    Quit date: 80    Years since quitting: 30.7   Smokeless tobacco: Never  Substance and Sexual Activity   Alcohol use: Yes    Comment: occasional/ 1 every 2-3 months   Drug use: Never   Sexual activity: Not on file  Other Topics Concern   Not on file  Social History Narrative   Not on file   Social Determinants of Health   Financial Resource Strain: Not on file  Food Insecurity: Not on file  Transportation Needs: Not on file  Physical Activity: Not on file  Stress: Not on file  Social Connections: Not on file     Family History: The patient's family history includes Other in his brother.  ROS:   ROS Please see the history of present illness.     All other systems reviewed and are negative.  EKGs/Labs/Other Studies Reviewed:    The following studies were reviewed today:   EKG:  EKG is   ordered today.  The ekg ordered today is personally reviewed and demonstrates sinus rhythm nonspecific conduction delay left axis deviation left atrial enlargement   Physical Exam:    VS:  BP (!) 180/80 (BP Location: Left Arm, Patient Position: Sitting, Cuff Size: Normal)   Pulse 89   Ht '5\' 7"'$  (1.702 m)   Wt 166 lb (75.3 kg)   SpO2 97%   BMI 26.00 kg/m     Wt Readings from Last 3 Encounters:  09/26/20 166 lb (75.3 kg)  07/16/20 165 lb (74.8 kg)     GEN: He looks younger than his age well nourished, well developed in no acute distress HEENT: Normal NECK: No JVD; No carotid bruits LYMPHATICS: No lymphadenopathy CARDIAC: RRR, no murmurs, rubs, gallops  RESPIRATORY:  Clear to auscultation without rales, wheezing or rhonchi  ABDOMEN: Soft, non-tender, non-distended MUSCULOSKELETAL: He has tense greater than 4+ edema above the knee bilaterally without stasis changes edema; No deformity  SKIN: Warm and dry NEUROLOGIC:  Alert and oriented x 3 PSYCHIATRIC:  Normal affect     Signed, Jack More, MD  09/26/2020 4:18 PM    [facility]

## 2020-09-26 NOTE — Patient Instructions (Addendum)
Medication Instructions:  Your physician has recommended you make the following change in your medication:  STOP: Amlodipine  START: Valsartan 160 mg take one tablet by mouth daily.  *If you need a refill on your cardiac medications before your next appointment, please call your pharmacy*   Lab Work: None If you have labs (blood work) drawn today and your tests are completely normal, you will receive your results only by: [facility] (if you have MyChart) OR A paper copy in the mail If you have any lab test that is abnormal or we need to change your treatment, we will call you to review the results.   Testing/Procedures: Your physician has requested that you have an echocardiogram. Echocardiography is a painless test that uses sound waves to create images of your heart. It provides your doctor with information about the size and shape of your heart and how well your heart's chambers and valves are working. This procedure takes approximately one hour. There are no restrictions for this procedure.  A zio monitor was ordered today. It will remain on for 14 days. You will then return monitor and event diary in provided box. It takes 1-2 weeks for report to be downloaded and returned to [place]. We will call you with the results. If monitor falls off or has orange flashing light, please call [facility] for further instructions.     Follow-Up: At [facility], you and your health needs are our priority.  As part of our continuing mission to provide you with exceptional heart care, we have created designated Provider Care Teams.  These Care Teams include your primary Cardiologist (physician) and Advanced Practice Providers (APPs -  Physician Assistants and Nurse Practitioners) who all work together to provide you with the care you need, when you need it.  We recommend signing up for the patient portal called "MyChart".  Sign up information is provided on this After Visit Summary.  MyChart is used to  connect with patients for Virtual Visits (Telemedicine).  Patients are able to view lab/test results, encounter notes, upcoming appointments, etc.  Non-urgent messages can be sent to your provider as well.   To learn more about what you can do with MyChart, go to [email or web address].    Your next appointment:   3 month(s)  The format for your next appointment:   In Person  Provider:   Shirlee More, MD   Other Instructions Please check your blood pressure twice daily for the next two weeks and let us know how they have been running.

## 2020-09-27 ENCOUNTER — Telehealth: Payer: Self-pay | Admitting: Cardiology

## 2020-09-27 NOTE — Telephone Encounter (Signed)
Patient would like clarification regarding the changes Dr. Bettina Gavia made to his medications at his office visit yesterday. He is not sure what to do. Please advise

## 2020-09-27 NOTE — Telephone Encounter (Signed)
Spoke to the patient just now and he was wanting to go over the medication instructions from yesterday. We did this together and he verbalizes understanding.    Encouraged patient to call back with any questions or concerns.

## 2020-10-03 DIAGNOSIS — R002 Palpitations: Secondary | ICD-10-CM

## 2020-10-09 DIAGNOSIS — R002 Palpitations: Secondary | ICD-10-CM | POA: Diagnosis not present

## 2020-10-10 ENCOUNTER — Ambulatory Visit (INDEPENDENT_AMBULATORY_CARE_PROVIDER_SITE_OTHER): Payer: Medicare Other

## 2020-10-10 ENCOUNTER — Other Ambulatory Visit: Payer: Self-pay

## 2020-10-10 DIAGNOSIS — I10 Essential (primary) hypertension: Secondary | ICD-10-CM | POA: Diagnosis not present

## 2020-10-10 DIAGNOSIS — R002 Palpitations: Secondary | ICD-10-CM | POA: Diagnosis not present

## 2020-10-10 DIAGNOSIS — R600 Localized edema: Secondary | ICD-10-CM | POA: Diagnosis not present

## 2020-10-10 DIAGNOSIS — R609 Edema, unspecified: Secondary | ICD-10-CM

## 2020-10-11 LAB — ECHOCARDIOGRAM COMPLETE
Area-P 1/2: 4.41 cm2
S' Lateral: 2.6 cm

## 2020-10-14 ENCOUNTER — Telehealth: Payer: Self-pay

## 2020-10-14 DIAGNOSIS — Z23 Encounter for immunization: Secondary | ICD-10-CM | POA: Diagnosis not present

## 2020-10-14 NOTE — Telephone Encounter (Signed)
-----   Message from Richardo Priest, MD sent at 10/14/2020  8:41 AM EDT ----- Good result no episodes of atrial fibrillation or flutter.

## 2020-10-14 NOTE — Telephone Encounter (Signed)
Left message on patients voicemail to please return our call.   

## 2020-10-14 NOTE — Telephone Encounter (Signed)
-----   Message from Richardo Priest, MD sent at 10/13/2020  4:49 PM EDT ----- This is a good result no findings of heart failure.

## 2020-10-14 NOTE — Telephone Encounter (Signed)
Spoke with patient regarding results and recommendation.  Patient verbalizes understanding and is agreeable to plan of care. Advised patient to call back with any issues or concerns.  

## 2020-10-18 DIAGNOSIS — E7800 Pure hypercholesterolemia, unspecified: Secondary | ICD-10-CM | POA: Diagnosis not present

## 2020-10-18 DIAGNOSIS — K219 Gastro-esophageal reflux disease without esophagitis: Secondary | ICD-10-CM | POA: Diagnosis not present

## 2020-10-18 DIAGNOSIS — I10 Essential (primary) hypertension: Secondary | ICD-10-CM | POA: Diagnosis not present

## 2020-11-18 DIAGNOSIS — E7800 Pure hypercholesterolemia, unspecified: Secondary | ICD-10-CM | POA: Diagnosis not present

## 2020-11-18 DIAGNOSIS — I10 Essential (primary) hypertension: Secondary | ICD-10-CM | POA: Diagnosis not present

## 2020-11-18 DIAGNOSIS — K219 Gastro-esophageal reflux disease without esophagitis: Secondary | ICD-10-CM | POA: Diagnosis not present

## 2020-11-19 DIAGNOSIS — M25511 Pain in right shoulder: Secondary | ICD-10-CM | POA: Diagnosis not present

## 2020-11-19 DIAGNOSIS — M25532 Pain in left wrist: Secondary | ICD-10-CM | POA: Diagnosis not present

## 2020-11-19 DIAGNOSIS — M25551 Pain in right hip: Secondary | ICD-10-CM | POA: Diagnosis not present

## 2020-11-19 DIAGNOSIS — M4722 Other spondylosis with radiculopathy, cervical region: Secondary | ICD-10-CM | POA: Diagnosis not present

## 2020-11-19 DIAGNOSIS — M25531 Pain in right wrist: Secondary | ICD-10-CM | POA: Diagnosis not present

## 2020-11-19 DIAGNOSIS — M25552 Pain in left hip: Secondary | ICD-10-CM | POA: Diagnosis not present

## 2020-11-25 DIAGNOSIS — R200 Anesthesia of skin: Secondary | ICD-10-CM | POA: Diagnosis not present

## 2020-12-04 DIAGNOSIS — I10 Essential (primary) hypertension: Secondary | ICD-10-CM | POA: Diagnosis not present

## 2020-12-10 ENCOUNTER — Telehealth: Payer: Self-pay | Admitting: Cardiology

## 2020-12-10 NOTE — Telephone Encounter (Signed)
[facility] called to let us know that his sitting and standing BP were high, they have been keeping track of them.  They increased his valsartan to 320mg .

## 2020-12-18 DIAGNOSIS — G5602 Carpal tunnel syndrome, left upper limb: Secondary | ICD-10-CM | POA: Diagnosis not present

## 2020-12-18 DIAGNOSIS — I10 Essential (primary) hypertension: Secondary | ICD-10-CM | POA: Diagnosis not present

## 2020-12-18 DIAGNOSIS — E7800 Pure hypercholesterolemia, unspecified: Secondary | ICD-10-CM | POA: Diagnosis not present

## 2020-12-18 DIAGNOSIS — G5601 Carpal tunnel syndrome, right upper limb: Secondary | ICD-10-CM | POA: Diagnosis not present

## 2020-12-18 DIAGNOSIS — K219 Gastro-esophageal reflux disease without esophagitis: Secondary | ICD-10-CM | POA: Diagnosis not present

## 2020-12-19 DIAGNOSIS — E7800 Pure hypercholesterolemia, unspecified: Secondary | ICD-10-CM | POA: Insufficient documentation

## 2020-12-19 DIAGNOSIS — M1990 Unspecified osteoarthritis, unspecified site: Secondary | ICD-10-CM | POA: Insufficient documentation

## 2020-12-19 DIAGNOSIS — D638 Anemia in other chronic diseases classified elsewhere: Secondary | ICD-10-CM | POA: Insufficient documentation

## 2020-12-19 DIAGNOSIS — Z8546 Personal history of malignant neoplasm of prostate: Secondary | ICD-10-CM | POA: Insufficient documentation

## 2020-12-19 DIAGNOSIS — I10 Essential (primary) hypertension: Secondary | ICD-10-CM | POA: Insufficient documentation

## 2020-12-19 DIAGNOSIS — R270 Ataxia, unspecified: Secondary | ICD-10-CM | POA: Insufficient documentation

## 2020-12-19 DIAGNOSIS — K219 Gastro-esophageal reflux disease without esophagitis: Secondary | ICD-10-CM | POA: Insufficient documentation

## 2020-12-19 DIAGNOSIS — E663 Overweight: Secondary | ICD-10-CM | POA: Insufficient documentation

## 2020-12-26 NOTE — Progress Notes (Signed)
Cardiology Office Note:    Date:  12/27/2020   ID:  Jack Turner, DOB 01-19-1932, MRN [id number]  PCP:  Angelina Sheriff, MD  Cardiologist:  Shirlee More, MD    Referring MD: Angelina Sheriff, MD    ASSESSMENT:    1. Edema, unspecified type   2. Primary hypertension   3. Palpitations    PLAN:    In order of problems listed above:  Jack Turner is doing well his peripheral edema has resolved his systolic is remaining above goal we will going to put him on very low-dose of beta-blockers suspect his blood pressure will trend then 262-035 systolic which I think is a good rate increment approaching age 59.  To simplify therapy we will use combination without statin hydrochlorothiazide.  His peripheral edema is due to calcium channel blocker no findings of heart failure or pulmonary hypertension on echocardiogram renal function remains preserved Presently not having palpitation he had brief runs of APCs and I think it benefit both rhythm as well as blood pressure with minimum dose 12.5 mg Toprol-XL daily   Next appointment: I will see him back in the office as needed follow-up for blood pressure with his PCP   Medication Adjustments/Labs and Tests Ordered: Current medicines are reviewed at length with the patient today.  Concerns regarding medicines are outlined above.  No orders of the defined types were placed in this encounter.  Meds ordered this encounter  Medications   valsartan-hydrochlorothiazide (DIOVAN-HCT) 320-12.5 MG tablet    Sig: Take 1 tablet by mouth daily.    Dispense:  90 tablet    Refill:  3   metoprolol succinate (TOPROL XL) 25 MG 24 hr tablet    Sig: Take 0.5 tablets (12.5 mg total) by mouth daily.    Dispense:  45 tablet    Refill:  3    Chief Complaint  Patient presents with   Follow-up   Leg Swelling   Palpitations    History of Present Illness:    Jack Turner is a 87 y.o. male with a hx of palpitation and edema with a CCB last seen  09/26/2020.  Compliance with diet, lifestyle and medications: Yes  His blood pressure meds have been uptitrated with his PCP now takes valsartan 320 mg daily. Blood pressure is measuring mostly in the range of 1 59-7 50 systolic over 41-63.  We discussed techniques for good blood pressure checks at home. His edema is resolved No chest pain shortness of breath lightheadedness palpitation or syncope.  Echo 10/10/2020:  1. Left ventricular ejection fraction, by estimation, is 60 to 65%. The  left ventricle has normal function. The left ventricle has no regional  wall motion abnormalities. Left ventricular diastolic parameters are  consistent with Grade I diastolic  dysfunction (impaired relaxation).   2. Right ventricular systolic function is normal. The right ventricular  size is normal. There is normal pulmonary artery systolic pressure.   3. The mitral valve is normal in structure. Mild mitral valve  regurgitation. No evidence of mitral stenosis.   4. The aortic valve is normal in structure. Aortic valve regurgitation is  not visualized. No aortic stenosis is present.   5. The inferior vena cava is normal in size with greater than 50%  respiratory variability, suggesting right atrial pressure of 3 mmHg.   Event monitor: Patch Wear Time:  6 days and 19 hours (2022-09-08T16:27:45-0400 to 2022-09-15T11:54:49-0400) Patient had a min HR of 54 bpm, max HR of 184  bpm, and avg HR of 72 bpm. Predominant underlying rhythm was Sinus Rhythm. 19 Supraventricular Tachycardia runs occurred, the run with the fastest interval lasting 4 beats with a max rate of 184 bpm, the  longest lasting 14 beats with an avg rate of 119 bpm. Some episodes of Supraventricular Tachycardia conducted with possible aberrancy. Isolated SVEs were rare (<1.0%), SVE Couplets were rare (<1.0%), and SVE Triplets were rare (<1.0%). Isolated VEs were  rare (<1.0%), and no VE Couplets or VE Triplets were present. There were no  pauses of 3 seconds or greater and no episodes of second or third-degree AV node block or sinus node exit block. Ventricular ectopy was rare. Supraventricular ectopy is rare with no episodes of atrial fibrillation or flutter.  There were brief runs of APCs and atrial tachycardia the longest 14 complexes rate of 119 bpm.  These were asymptomatic. There was 1 symptomatic event associated with sinus rhythm 86 bpm Conclusion unremarkable 7-day event monitor. Past Medical History:  Diagnosis Date   Anemia of chronic disorder    Arthritis, degenerative    Ataxia    GERD (gastroesophageal reflux disease)    History of prostate cancer    Hypercholesterolemia    Hypertension    Overweight     Past Surgical History:  Procedure Laterality Date   CATARACT EXTRACTION Right    INGUINAL HERNIA REPAIR Right 08/03/2006   Performed by Dr. Sherald Hess at [facility] Bilateral 02/23/1996    Current Medications: Current Meds  Medication Sig   ALPHAGAN P 0.1 % SOLN Place 1 drop into the left eye 2 (two) times daily.   b complex vitamins capsule Take 1 capsule by mouth every other day.   Calcium Carbonate-Vitamin D (CALCIUM-VITAMIN D) 600-125 MG-UNIT TABS Take 1 tablet by mouth daily.   dorzolamide (TRUSOPT) 2 % ophthalmic solution Place 1 drop into the left eye 2 (two) times daily.   latanoprost (XALATAN) 0.005 % ophthalmic solution Place 1 drop into both eyes 2 (two) times daily.   metoprolol succinate (TOPROL XL) 25 MG 24 hr tablet Take 0.5 tablets (12.5 mg total) by mouth daily.   Misc Natural Products (URINOZINC PO) Take 1 tablet by mouth daily. Strength unknown   Multiple Vitamin (MULTIVITAMIN) tablet Take 1 tablet by mouth daily.   Multiple Vitamins-Minerals (EYE HEALTH PO) Take 1 tablet by mouth daily.   omeprazole (PRILOSEC) 20 MG capsule Take 20 mg by mouth every morning.   SYMBICORT 160-4.5 MCG/ACT inhaler Inhale 2 puffs into the lungs 2 (two) times  daily.   valsartan-hydrochlorothiazide (DIOVAN-HCT) 320-12.5 MG tablet Take 1 tablet by mouth daily.   [DISCONTINUED] hydrochlorothiazide (HYDRODIURIL) 25 MG tablet Take 25 mg by mouth in the morning.   [DISCONTINUED] valsartan (DIOVAN) 320 MG tablet Take 320 mg by mouth daily.     Allergies:   Hydrochlorothiazide   Social History   Socioeconomic History   Marital status: Married    Spouse name: Not on file   Number of children: Not on file   Years of education: Not on file   Highest education level: Not on file  Occupational History   Not on file  Tobacco Use   Smoking status: Former    Types: Cigarettes    Quit date: 19    Years since quitting: 30.9   Smokeless tobacco: Never  Substance and Sexual Activity   Alcohol use: Yes    Comment: occasional/ 1 every 2-3 months  Drug use: Never   Sexual activity: Not on file  Other Topics Concern   Not on file  Social History Narrative   Not on file   Social Determinants of Health   Financial Resource Strain: Not on file  Food Insecurity: Not on file  Transportation Needs: Not on file  Physical Activity: Not on file  Stress: Not on file  Social Connections: Not on file     Family History: The patient's family history includes Other in his brother. ROS:   Please see the history of present illness.    All other systems reviewed and are negative.  EKGs/Labs/Other Studies Reviewed:    The following studies were reviewed today:    Recent Labs: 12/04/2020 potassium 3.6 creatinine 1.10  Physical Exam:    VS:  BP (!) 186/84   Pulse 78   Ht 5\' 7"  (1.702 m)   Wt 167 lb (75.8 kg)   SpO2 97%   BMI 26.16 kg/m     Wt Readings from Last 3 Encounters:  12/27/20 167 lb (75.8 kg)  09/26/20 166 lb (75.3 kg)  07/16/20 165 lb (74.8 kg)     GEN:  Well nourished, well developed in no acute distress HEENT: Normal NECK: No JVD; No carotid bruits LYMPHATICS: No lymphadenopathy CARDIAC: RRR, no murmurs, rubs,  gallops RESPIRATORY:  Clear to auscultation without rales, wheezing or rhonchi  ABDOMEN: Soft, non-tender, non-distended MUSCULOSKELETAL:  No edema; No deformity  SKIN: Warm and dry NEUROLOGIC:  Alert and oriented x 3 PSYCHIATRIC:  Normal affect    Signed, Shirlee More, MD  12/27/2020 10:48 AM    [place]

## 2020-12-27 ENCOUNTER — Encounter: Payer: Self-pay | Admitting: Cardiology

## 2020-12-27 ENCOUNTER — Other Ambulatory Visit: Payer: Self-pay

## 2020-12-27 ENCOUNTER — Ambulatory Visit (INDEPENDENT_AMBULATORY_CARE_PROVIDER_SITE_OTHER): Payer: Medicare Other | Admitting: Cardiology

## 2020-12-27 VITALS — BP 186/84 | HR 78 | Ht 67.0 in | Wt 167.0 lb

## 2020-12-27 DIAGNOSIS — R609 Edema, unspecified: Secondary | ICD-10-CM

## 2020-12-27 DIAGNOSIS — I10 Essential (primary) hypertension: Secondary | ICD-10-CM

## 2020-12-27 DIAGNOSIS — R002 Palpitations: Secondary | ICD-10-CM | POA: Diagnosis not present

## 2020-12-27 MED ORDER — METOPROLOL SUCCINATE ER 25 MG PO TB24
12.5000 mg | ORAL_TABLET | Freq: Every day | ORAL | 3 refills | Status: DC
Start: 2020-12-27 — End: 2021-01-31

## 2020-12-27 MED ORDER — VALSARTAN-HYDROCHLOROTHIAZIDE 320-12.5 MG PO TABS: 1.0000 | ORAL_TABLET | Freq: Every day | ORAL | 3 refills | Status: DC

## 2020-12-27 NOTE — Patient Instructions (Signed)
Medication Instructions:  Your physician has recommended you make the following change in your medication:  START: TOPROL XL 12.5 mg take 0.5 tablet by mouth daily. CHANGE: Valsartan 320/12.5 mg take one tablet by mouth daily.  *If you need a refill on your cardiac medications before your next appointment, please call your pharmacy*   Lab Work: None If you have labs (blood work) drawn today and your tests are completely normal, you will receive your results only by: [facility] (if you have MyChart) OR A paper copy in the mail If you have any lab test that is abnormal or we need to change your treatment, we will call you to review the results.   Testing/Procedures: None   Follow-Up: At [facility], you and your health needs are our priority.  As part of our continuing mission to provide you with exceptional heart care, we have created designated Provider Care Teams.  These Care Teams include your primary Cardiologist (physician) and Advanced Practice Providers (APPs -  Physician Assistants and Nurse Practitioners) who all work together to provide you with the care you need, when you need it.  We recommend signing up for the patient portal called "MyChart".  Sign up information is provided on this After Visit Summary.  MyChart is used to connect with patients for Virtual Visits (Telemedicine).  Patients are able to view lab/test results, encounter notes, upcoming appointments, etc.  Non-urgent messages can be sent to your provider as well.   To learn more about what you can do with MyChart, go to [email or web address].    Your next appointment:   As needed  The format for your next appointment:   In Person  Provider:   Shirlee More, MD    Other Instructions

## 2021-01-02 DIAGNOSIS — M65321 Trigger finger, right index finger: Secondary | ICD-10-CM | POA: Diagnosis not present

## 2021-01-02 DIAGNOSIS — G5601 Carpal tunnel syndrome, right upper limb: Secondary | ICD-10-CM | POA: Diagnosis not present

## 2021-01-02 DIAGNOSIS — G5602 Carpal tunnel syndrome, left upper limb: Secondary | ICD-10-CM | POA: Diagnosis not present

## 2021-01-21 ENCOUNTER — Telehealth: Payer: Self-pay

## 2021-01-21 NOTE — Telephone Encounter (Signed)
Attempted to call the patient. The phone was picked up but hanged up before I could speak with him. Attempted to call the same number a second time, no one picked up. Will try again later.

## 2021-01-21 NOTE — Telephone Encounter (Signed)
° °  Pre-operative Risk Assessment    Patient Name: Jack Turner  DOB: May 18, 1931 MRN: [id number]      Request for Surgical Clearance    Procedure:   Left Hand Carpal Tunnel Release   Date of Surgery:  Clearance TBD                                 Surgeon:  Dorna Leitz, MD Surgeon's Group or Practice Name:  [facility] Phone number:  [phone] Fax number:  [phone]   Type of Clearance Requested:   - Medical    Type of Anesthesia:   Unspecified   Additional requests/questions:  Please advise surgeon/provider what medications should be held.  Signed, Gita Kudo   01/21/2021, 8:18 AM

## 2021-01-22 NOTE — Telephone Encounter (Signed)
° °  Name: Jack Turner  DOB: [birth year]  MRN: [id number]   Primary Cardiologist: None  Chart reviewed as part of pre-operative protocol coverage. Patient was contacted 01/22/2021 in reference to pre-operative risk assessment for pending surgery as outlined below.  Jack Turner was last seen on 12/27/2020 by Dr. Bettina Gavia.  Since that day, Jack Turner has done well without exertional chest pain or worsening dyspnea.  Despite his advanced age, he has very good functional ability and is quite independent.  Given the low risk nature of the surgery and his high functional ability without exertional symptoms, he is cleared for the procedure.  Therefore, based on ACC/AHA guidelines, the patient would be at acceptable risk for the planned procedure without further cardiovascular testing.   The patient was advised that if he develops new symptoms prior to surgery to contact our office to arrange for a follow-up visit, and he verbalized understanding.  I will route this recommendation to the requesting party via Epic fax function and remove from pre-op pool. Please call with questions.  Almyra Deforest, [place] 01/22/2021, 6:31 PM

## 2021-01-27 DIAGNOSIS — Z0000 Encounter for general adult medical examination without abnormal findings: Secondary | ICD-10-CM | POA: Diagnosis not present

## 2021-01-27 DIAGNOSIS — Z6825 Body mass index (BMI) 25.0-25.9, adult: Secondary | ICD-10-CM | POA: Diagnosis not present

## 2021-01-27 DIAGNOSIS — Z1331 Encounter for screening for depression: Secondary | ICD-10-CM | POA: Diagnosis not present

## 2021-01-27 DIAGNOSIS — I10 Essential (primary) hypertension: Secondary | ICD-10-CM | POA: Diagnosis not present

## 2021-01-27 DIAGNOSIS — Z6826 Body mass index (BMI) 26.0-26.9, adult: Secondary | ICD-10-CM | POA: Diagnosis not present

## 2021-01-31 ENCOUNTER — Telehealth: Payer: Self-pay | Admitting: Cardiology

## 2021-01-31 MED ORDER — METOPROLOL SUCCINATE ER 25 MG PO TB24: 25.0000 mg | ORAL_TABLET | Freq: Every day | ORAL | 3 refills | Status: DC

## 2021-01-31 NOTE — Telephone Encounter (Signed)
Spoke to the patient just now and let him know Dr. Joya Gaskins recommendations. He verbalizes understanding and will call us in 14 days to give [place] his BP readings.    Encouraged patient to call back with any questions or concerns.

## 2021-01-31 NOTE — Telephone Encounter (Signed)
Pt c/o medication issue:  1. Name of Medication: valsartan-hydrochlorothiazide (DIOVAN-HCT) 320-12.5 MG tablet  2. How are you currently taking this medication (dosage and times per day)? Take 1 tablet by mouth daily.  3. Are you having a reaction (difficulty breathing--STAT)? no  4. What is your medication issue? bp is not coming down like it should when taking valsartan

## 2021-01-31 NOTE — Telephone Encounter (Signed)
Spoke to the patient just now and he let me know that his blood pressure has been elevated still even after starting his Valsartan/HCTZ. He checks his blood pressure several hours after taking his medications in the morning.   2/2 - 146/77  2/3 - 150/79   His toprol XL was increased to one tablet 25 mg daily on the 10th by his PCP.   2/10- 148/73 2/12 - 149/78 2/13 - 180/94 in the am and 161/88 in the afternoon  He seems to be a poor historian on the phone and is very anxious. He states that he will sit down and check his blood pressure 5-6 times in a row just to see if it will get better.   I will route to Dr. Bettina Gavia to get his recommendations on this.

## 2021-02-10 DIAGNOSIS — M659 Synovitis and tenosynovitis, unspecified: Secondary | ICD-10-CM | POA: Diagnosis not present

## 2021-02-10 DIAGNOSIS — G5602 Carpal tunnel syndrome, left upper limb: Secondary | ICD-10-CM | POA: Diagnosis not present

## 2021-02-12 ENCOUNTER — Telehealth: Payer: Self-pay | Admitting: Cardiology

## 2021-02-12 NOTE — Telephone Encounter (Signed)
Called patient to get more information.Patient's blood pressure had been elevated in the past and was instructed by Dr. Bettina Gavia to record his blood pressure for two weeks and bring it in to the office. Patient was concerned because he had shoulder surgery this past Monday and would not be able to do a total of two weeks of blood pressures as Dr. Bettina Gavia requested. Patient instructed to bring in the blood pressures that he has already recorded for Dr. Bettina Gavia to evaluate. Patient had no further questions.

## 2021-02-12 NOTE — Telephone Encounter (Signed)
Patient wants to speak with the nurse bout his bp. Please advise

## 2021-02-18 DIAGNOSIS — M545 Low back pain, unspecified: Secondary | ICD-10-CM | POA: Diagnosis not present

## 2021-02-18 DIAGNOSIS — G5602 Carpal tunnel syndrome, left upper limb: Secondary | ICD-10-CM | POA: Diagnosis not present

## 2021-02-19 ENCOUNTER — Telehealth: Payer: Self-pay | Admitting: Cardiology

## 2021-02-19 MED ORDER — METOPROLOL SUCCINATE ER 25 MG PO TB24: 25.0000 mg | ORAL_TABLET | Freq: Every day | ORAL | 3 refills | Status: DC

## 2021-02-19 MED ORDER — AMLODIPINE BESYLATE 2.5 MG PO TABS: 2.5000 mg | ORAL_TABLET | Freq: Every day | ORAL | 3 refills | Status: DC

## 2021-02-19 NOTE — Addendum Note (Signed)
Addended by: Resa Miner I on: 02/19/2021 04:53 PM   Modules accepted: Orders

## 2021-02-19 NOTE — Telephone Encounter (Signed)
Patient is asking that nurse call him bout the paperwork he left to be filled out. Please advise

## 2021-02-19 NOTE — Telephone Encounter (Signed)
Spoke to patient just now and let him know Dr. Joya Gaskins recommendations. Instead of the losartan we are going to start him on amlodipine 2.5 mg daily per Dr. Bettina Gavia.   Encouraged patient to call back with any questions or concerns.

## 2021-02-19 NOTE — Telephone Encounter (Signed)
Spoke to the patient just now. He wanted to know what Dr. Bettina Gavia thought of the blood pressure readings that he dropped off at the office yesterday. I advised him that Dr. Bettina Gavia has not had a chance to look at the readings yet but once he does we will call him back with any kind of recommendations that he might have.    Encouraged patient to call back with any questions or concerns.

## 2021-03-10 DIAGNOSIS — C61 Malignant neoplasm of prostate: Secondary | ICD-10-CM | POA: Diagnosis not present

## 2021-03-10 DIAGNOSIS — N3289 Other specified disorders of bladder: Secondary | ICD-10-CM | POA: Diagnosis not present

## 2021-03-13 DIAGNOSIS — H9193 Unspecified hearing loss, bilateral: Secondary | ICD-10-CM | POA: Diagnosis not present

## 2021-03-13 DIAGNOSIS — H6123 Impacted cerumen, bilateral: Secondary | ICD-10-CM | POA: Diagnosis not present

## 2021-03-13 DIAGNOSIS — H61303 Acquired stenosis of external ear canal, unspecified, bilateral: Secondary | ICD-10-CM | POA: Diagnosis not present

## 2021-04-08 DIAGNOSIS — R32 Unspecified urinary incontinence: Secondary | ICD-10-CM | POA: Diagnosis not present

## 2021-05-19 DIAGNOSIS — H18413 Arcus senilis, bilateral: Secondary | ICD-10-CM | POA: Diagnosis not present

## 2021-05-19 DIAGNOSIS — H02839 Dermatochalasis of unspecified eye, unspecified eyelid: Secondary | ICD-10-CM | POA: Diagnosis not present

## 2021-05-19 DIAGNOSIS — H401133 Primary open-angle glaucoma, bilateral, severe stage: Secondary | ICD-10-CM | POA: Diagnosis not present

## 2021-05-19 DIAGNOSIS — Z961 Presence of intraocular lens: Secondary | ICD-10-CM | POA: Diagnosis not present

## 2021-06-18 DIAGNOSIS — E7800 Pure hypercholesterolemia, unspecified: Secondary | ICD-10-CM | POA: Diagnosis not present

## 2021-06-18 DIAGNOSIS — I10 Essential (primary) hypertension: Secondary | ICD-10-CM | POA: Diagnosis not present

## 2021-06-18 DIAGNOSIS — K219 Gastro-esophageal reflux disease without esophagitis: Secondary | ICD-10-CM | POA: Diagnosis not present

## 2021-06-27 DIAGNOSIS — R32 Unspecified urinary incontinence: Secondary | ICD-10-CM | POA: Diagnosis not present

## 2021-07-10 DIAGNOSIS — M5441 Lumbago with sciatica, right side: Secondary | ICD-10-CM | POA: Diagnosis not present

## 2021-07-10 DIAGNOSIS — M5136 Other intervertebral disc degeneration, lumbar region: Secondary | ICD-10-CM | POA: Diagnosis not present

## 2021-07-10 DIAGNOSIS — M25551 Pain in right hip: Secondary | ICD-10-CM | POA: Diagnosis not present

## 2021-07-19 DIAGNOSIS — M545 Low back pain, unspecified: Secondary | ICD-10-CM | POA: Diagnosis not present

## 2021-07-19 DIAGNOSIS — R222 Localized swelling, mass and lump, trunk: Secondary | ICD-10-CM | POA: Diagnosis not present

## 2021-07-19 DIAGNOSIS — N390 Urinary tract infection, site not specified: Secondary | ICD-10-CM | POA: Diagnosis not present

## 2021-07-19 DIAGNOSIS — M546 Pain in thoracic spine: Secondary | ICD-10-CM | POA: Diagnosis not present

## 2021-07-19 DIAGNOSIS — R918 Other nonspecific abnormal finding of lung field: Secondary | ICD-10-CM | POA: Diagnosis not present

## 2021-07-19 DIAGNOSIS — N1330 Unspecified hydronephrosis: Secondary | ICD-10-CM | POA: Diagnosis not present

## 2021-07-19 DIAGNOSIS — I700 Atherosclerosis of aorta: Secondary | ICD-10-CM | POA: Diagnosis not present

## 2021-07-19 DIAGNOSIS — N136 Pyonephrosis: Secondary | ICD-10-CM | POA: Diagnosis not present

## 2021-07-19 DIAGNOSIS — R001 Bradycardia, unspecified: Secondary | ICD-10-CM | POA: Diagnosis not present

## 2021-07-19 DIAGNOSIS — J984 Other disorders of lung: Secondary | ICD-10-CM | POA: Diagnosis not present

## 2021-07-19 DIAGNOSIS — R700 Elevated erythrocyte sedimentation rate: Secondary | ICD-10-CM | POA: Diagnosis not present

## 2021-07-19 DIAGNOSIS — D72829 Elevated white blood cell count, unspecified: Secondary | ICD-10-CM | POA: Diagnosis not present

## 2021-07-19 DIAGNOSIS — I10 Essential (primary) hypertension: Secondary | ICD-10-CM | POA: Diagnosis not present

## 2021-07-19 DIAGNOSIS — D649 Anemia, unspecified: Secondary | ICD-10-CM | POA: Diagnosis not present

## 2021-07-19 DIAGNOSIS — N281 Cyst of kidney, acquired: Secondary | ICD-10-CM | POA: Diagnosis not present

## 2021-07-19 DIAGNOSIS — N134 Hydroureter: Secondary | ICD-10-CM | POA: Diagnosis not present

## 2021-07-19 DIAGNOSIS — T17800A Unspecified foreign body in other parts of respiratory tract causing asphyxiation, initial encounter: Secondary | ICD-10-CM | POA: Diagnosis not present

## 2021-07-19 DIAGNOSIS — M549 Dorsalgia, unspecified: Secondary | ICD-10-CM | POA: Diagnosis not present

## 2021-07-19 DIAGNOSIS — J9859 Other diseases of mediastinum, not elsewhere classified: Secondary | ICD-10-CM | POA: Diagnosis not present

## 2021-07-29 DIAGNOSIS — Z6824 Body mass index (BMI) 24.0-24.9, adult: Secondary | ICD-10-CM | POA: Diagnosis not present

## 2021-07-29 DIAGNOSIS — D72829 Elevated white blood cell count, unspecified: Secondary | ICD-10-CM | POA: Diagnosis not present

## 2021-07-29 DIAGNOSIS — I10 Essential (primary) hypertension: Secondary | ICD-10-CM | POA: Diagnosis not present

## 2021-07-29 DIAGNOSIS — N179 Acute kidney failure, unspecified: Secondary | ICD-10-CM | POA: Diagnosis not present

## 2021-07-29 DIAGNOSIS — N390 Urinary tract infection, site not specified: Secondary | ICD-10-CM | POA: Diagnosis not present

## 2021-07-30 ENCOUNTER — Telehealth: Payer: Self-pay | Admitting: Oncology

## 2021-07-30 NOTE — Telephone Encounter (Signed)
Scheduled appt per 7/12 referral. Pt is aware of appt date and time. Pt is aware to arrive 15 mins prior to appt time and to bring and updated insurance card. Pt is aware of appt location.   

## 2021-08-06 ENCOUNTER — Other Ambulatory Visit: Payer: Self-pay | Admitting: Oncology

## 2021-08-06 DIAGNOSIS — D638 Anemia in other chronic diseases classified elsewhere: Secondary | ICD-10-CM

## 2021-08-06 NOTE — Progress Notes (Signed)
[facility]  [address] [place],  [place]  [address] [phone]  Clinic Day:  08/07/2021  Referring physician: Angelina Sheriff, MD   HISTORY OF PRESENT ILLNESS:  The patient is a 88 y.o. male  who I was asked to consult upon for abnormal findings seen on his recent scans.  According to the patient, he went to the emergency room in early July 2023 due to having excruciating back pain.  An MRI of his thoracic spine was done at that time, which did not show any particularly abnormal findings.  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  This mass was not thought to be malignant in nature.  Furthermore, there was no invasion of this mass of his disc spaces or osseous spine.  Per outside records, this gentleman had an epidural injection 2 weeks before this MRI was done.  He actually comes in today claiming that his severe back pain has essentially dissipated.  Of note, this gentleman does have a history of anemia dating back at least 12 years.  He denies having any overt forms of blood loss to explain his anemia.  His last colonoscopy was approximately 10 years ago, which he says came back negative.  He does not take any iron.  To his knowledge, there is no family history of anemia or other hematologic disorders.  Outside of his back pain, he denies there being other particular changes in his health.  PAST MEDICAL HISTORY:   Past Medical History:  Diagnosis Date   Anemia of chronic disorder    Arthritis, degenerative    Ataxia    GERD (gastroesophageal reflux disease)    History of prostate cancer    Hypercholesterolemia    Hypertension    Overweight     PAST SURGICAL HISTORY:   Past Surgical History:  Procedure Laterality Date   CATARACT EXTRACTION Right    INGUINAL HERNIA REPAIR Right 08/03/2006   Performed by Dr. Sherald Hess at [facility]  Bilateral 02/23/1996    CURRENT MEDICATIONS:   Current Outpatient Medications  Medication Sig Dispense Refill   ALPHAGAN P 0.1 % SOLN Place 1 drop into the left eye 2 (two) times daily.     amLODipine (NORVASC) 2.5 MG tablet Take 1 tablet (2.5 mg total) by mouth daily. 180 tablet 3   b complex vitamins capsule Take 1 capsule by mouth every other day.     Calcium Carbonate-Vitamin D (CALCIUM-VITAMIN D) 600-125 MG-UNIT TABS Take 1 tablet by mouth daily.     dorzolamide (TRUSOPT) 2 % ophthalmic solution Place 1 drop into the left eye 2 (two) times daily.     latanoprost (XALATAN) 0.005 % ophthalmic solution Place 1 drop into both eyes 2 (two) times daily.     metoprolol succinate (TOPROL XL) 25 MG 24 hr tablet Take 1 tablet (25 mg total) by mouth daily. 90 tablet 3   Misc Natural Products (URINOZINC PO) Take 1 tablet by mouth daily. Strength unknown     Multiple Vitamin (MULTIVITAMIN) tablet Take 1 tablet by mouth daily.     Multiple Vitamins-Minerals (EYE HEALTH PO) Take 1 tablet by mouth daily.     omeprazole (PRILOSEC) 20 MG capsule Take 20 mg by mouth every morning.     SYMBICORT 160-4.5 MCG/ACT inhaler Inhale 2 puffs into the lungs 2 (two) times daily.     valsartan-hydrochlorothiazide (  DIOVAN-HCT) 320-12.5 MG tablet Take 1 tablet by mouth daily. 90 tablet 3   No current facility-administered medications for this visit.    ALLERGIES:   Allergies  Allergen Reactions   Hydrochlorothiazide Other (See Comments)    Caused renal insufficiency    FAMILY HISTORY:   Family History  Problem Relation Age of Onset   Other Brother        Died in [place] accident   Both parents apparently died from natural causes.  SOCIAL HISTORY:  The patient was born and raised in [place], [place].  He currently lives in town.  He is widowed; he was previously married for 56 years.  He has 3 children, 3 grandchildren, and 4 great-grandchildren.  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  He  did smoke a pack of cigarettes daily for 25 years before quitting over 45 years ago.  He did drink alcohol occasionally in the remote past.  REVIEW OF SYSTEMS:  Review of Systems  Constitutional:  Positive for unexpected weight change. Negative for fatigue and fever.  HENT:   Positive for hearing loss.   Respiratory:  Negative for chest tightness, cough, hemoptysis and shortness of breath.   Cardiovascular:  Negative for chest pain and palpitations.  Gastrointestinal:  Positive for diarrhea. Negative for abdominal distention, abdominal pain, blood in stool, constipation, nausea and vomiting.  Genitourinary:  Negative for dysuria, frequency and hematuria.   Musculoskeletal:  Negative for arthralgias, back pain and myalgias.  Skin:  Negative for itching and rash.  Neurological:  Negative for dizziness, headaches and light-headedness.  Psychiatric/Behavioral:  Negative for depression and suicidal ideas. The patient is not nervous/anxious.     PHYSICAL EXAM:  Blood pressure (!) 183/86, pulse 88, temperature 97.7 F (36.5 C), resp. rate 16, height '5\' 7"'$  (1.702 m), weight 156 lb 1.6 oz (70.8 kg), SpO2 97 %. Wt Readings from Last 3 Encounters:  08/07/21 156 lb 1.6 oz (70.8 kg)  12/27/20 167 lb (75.8 kg)  09/26/20 166 lb (75.3 kg)   Body mass index is 24.45 kg/m. Performance status (ECOG): 1 - Symptomatic but completely ambulatory Physical Exam Constitutional:      Appearance: Normal appearance. He is not ill-appearing.  HENT:     Mouth/Throat:     Mouth: Mucous membranes are moist.     Pharynx: Oropharynx is clear. No oropharyngeal exudate or posterior oropharyngeal erythema.  Cardiovascular:     Rate and Rhythm: Normal rate and regular rhythm.     Heart sounds: No murmur heard.    No friction rub. No gallop.  Pulmonary:     Effort: Pulmonary effort is normal. No respiratory distress.     Breath sounds: Normal breath sounds. No wheezing, rhonchi or rales.  Abdominal:     General:  Bowel sounds are normal. There is no distension.     Palpations: Abdomen is soft. There is no mass.     Tenderness: There is no abdominal tenderness.  Musculoskeletal:        General: No swelling.     Right lower leg: No edema.     Left lower leg: No edema.  Lymphadenopathy:     Cervical: No cervical adenopathy.     Upper Body:     Right upper body: No supraclavicular or axillary adenopathy.     Left upper body: No supraclavicular or axillary adenopathy.     Lower Body: No right inguinal adenopathy. No left inguinal adenopathy.  Skin:    General:  Skin is warm.     Coloration: Skin is not jaundiced.     Findings: No lesion or rash.  Neurological:     General: No focal deficit present.     Mental Status: He is alert and oriented to person, place, and time. Mental status is at baseline.  Psychiatric:        Mood and Affect: Mood normal.        Behavior: Behavior normal.        Thought Content: Thought content normal.    LABS:      Latest Ref Rng & Units 08/07/2021   12:00 AM 05/13/2007    5:55 AM 05/12/2007    5:25 AM  CBC  WBC  6.9     12.1  11.1   Hemoglobin 13.5 - 17.5 10.2     10.0  10.8   Hematocrit 41 - 53 30     29.2  30.5   Platelets 150 - 400 K/uL 480     194  205      This result is from an external source.      Latest Ref Rng & Units 08/07/2021   12:00 AM 05/12/2007    5:25 AM 05/05/2007    1:13 PM  CMP  Glucose   129  99   BUN 4 - '21 20     17  18   '$ Creatinine 0.6 - 1.3 1.1     1.19  1.10   Sodium 137 - 147 139     141  138   Potassium 3.5 - 5.1 mEq/L 3.8     3.6  4.2   Chloride 99 - 108 107     107  105   CO2 13 - '22 22     26  25   '$ Calcium 8.7 - 10.7 8.7     8.1  8.9   Total Protein    6.7   Total Bilirubin    0.8   Alkaline Phos 25 - 125 105      65   AST 14 - 40 29      39   ALT 10 - 40 U/L 24      32      This result is from an external source.   Review Flowsheet       Latest Ref Rng & Units 08/07/2021  Oncology Labs  Ferritin 24 - 336 ng/mL  95   %SAT 17.9 - 39.5 % 14   Total Protein ELP 6.0 - 8.5 g/dL 6.9   Albumin ELP 2.9 - 4.4 g/dL 2.9   Alpha-1 Globulin 0.0 - 0.4 g/dL 0.3   Alpha-2 Globulin 0.4 - 1.0 g/dL 1.1   Beta Globulin 0.7 - 1.3 g/dL 0.9   Gamma Globulin 0.4 - 1.8 g/dL 1.6   M-Spike, % Not Observed g/dL Not Observed   SPE Interp. - Comment   LDH 98 - 192 U/L 137     Latest Reference Range & Units 08/07/21 16:25  Iron 45 - 182 ug/dL 33 (L)  UIBC ug/dL 205  TIBC 250 - 450 ug/dL 238 (L)  Saturation Ratios 17.9 - 39.5 % 14 (L)  Ferritin 24 - 336 ng/mL 95  Folate >5.9 ng/mL >40.0  Vitamin B12 180 - 914 pg/mL 594  (L): Data is abnormally low  ASSESSMENT & PLAN:  An 88 y.o. male who I was asked to consult upon as a recent MRI showed questionable bone marrow findings.  In clinic today,  I went over all of his thoracic MRI images with him to reassure him that there was nothing that was worrisome for an ominous hematologic or oncologic process being present.  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  With respect to his anemia, his hemoglobin has been stable for the past 14 years.  Labs today did not show any evidence of a nutritional deficiency.  A serum protein electrophoresis also did not reveal the possibility of a plasma cell dyscrasia being present.  When evaluating his iron parameters, they were consistent with anemia of disease more so than iron deficiency.  Clinically, the patient claims to be doing well, particularly as his back pain has now dissipated.  As he felt reassured about his MRI not showing any ominous findings, he has no problem with his care being returned back to his primary care office.  I would not have a problem seeing this patient in the future if new hematologic or oncologic issues arise that require repeat clinical assessment..The patient understands all the plans discussed today and is in agreement with them.  I do  appreciate Angelina Sheriff, MD for his new consult.   Onika Gudiel Macarthur Critchley, MD

## 2021-08-07 ENCOUNTER — Other Ambulatory Visit: Payer: Self-pay | Admitting: Oncology

## 2021-08-07 ENCOUNTER — Inpatient Hospital Stay: Payer: Medicare Other

## 2021-08-07 ENCOUNTER — Inpatient Hospital Stay: Payer: Medicare Other | Attending: Oncology | Admitting: Oncology

## 2021-08-07 VITALS — BP 183/86 | HR 88 | Temp 97.7°F | Resp 16 | Ht 67.0 in | Wt 156.1 lb

## 2021-08-07 DIAGNOSIS — Z87891 Personal history of nicotine dependence: Secondary | ICD-10-CM | POA: Insufficient documentation

## 2021-08-07 DIAGNOSIS — Z79899 Other long term (current) drug therapy: Secondary | ICD-10-CM | POA: Insufficient documentation

## 2021-08-07 DIAGNOSIS — Z8546 Personal history of malignant neoplasm of prostate: Secondary | ICD-10-CM | POA: Insufficient documentation

## 2021-08-07 DIAGNOSIS — D638 Anemia in other chronic diseases classified elsewhere: Secondary | ICD-10-CM

## 2021-08-07 DIAGNOSIS — D649 Anemia, unspecified: Secondary | ICD-10-CM

## 2021-08-07 DIAGNOSIS — R937 Abnormal findings on diagnostic imaging of other parts of musculoskeletal system: Secondary | ICD-10-CM

## 2021-08-07 LAB — CBC AND DIFFERENTIAL
HCT: 30 — AB (ref 41–53)
Hemoglobin: 10.2 — AB (ref 13.5–17.5)
Neutrophils Absolute: 4.76
Platelets: 480 10*3/uL — AB (ref 150–400)
WBC: 6.9

## 2021-08-07 LAB — LACTATE DEHYDROGENASE: LDH: 137 U/L (ref 98–192)

## 2021-08-07 LAB — IRON AND TIBC
Iron: 33 ug/dL — ABNORMAL LOW (ref 45–182)
Saturation Ratios: 14 % — ABNORMAL LOW (ref 17.9–39.5)
TIBC: 238 ug/dL — ABNORMAL LOW (ref 250–450)
UIBC: 205 ug/dL

## 2021-08-07 LAB — BASIC METABOLIC PANEL
BUN: 20 (ref 4–21)
CO2: 22 (ref 13–22)
Chloride: 107 (ref 99–108)
Creatinine: 1.1 (ref 0.6–1.3)
Glucose: 106
Potassium: 3.8 mEq/L (ref 3.5–5.1)
Sodium: 139 (ref 137–147)

## 2021-08-07 LAB — FOLATE: Folate: >40 ng/mL (ref >5.9)

## 2021-08-07 LAB — HEPATIC FUNCTION PANEL
ALT: 24 U/L (ref 10–40)
AST: 29 (ref 14–40)
Alkaline Phosphatase: 105 (ref 25–125)
Bilirubin, Total: 0.4

## 2021-08-07 LAB — COMPREHENSIVE METABOLIC PANEL
Albumin: 3.8 (ref 3.5–5.0)
Calcium: 8.7 (ref 8.7–10.7)

## 2021-08-07 LAB — TSH: TSH: 5.174 u[IU]/mL — ABNORMAL HIGH (ref 0.350–4.500)

## 2021-08-07 LAB — VITAMIN B12: Vitamin B-12: 594 pg/mL (ref 180–914)

## 2021-08-07 LAB — FERRITIN: Ferritin: 95 ng/mL (ref 24–336)

## 2021-08-07 LAB — CBC: RBC: 3.34 — AB (ref 3.87–5.11)

## 2021-08-08 ENCOUNTER — Encounter: Payer: Self-pay | Admitting: Oncology

## 2021-08-08 LAB — TESTOSTERONE: Testosterone: 513 ng/dL (ref 264–916)

## 2021-08-12 LAB — PROTEIN ELECTROPHORESIS, SERUM
A/G Ratio: 0.7 (ref 0.7–1.7)
Albumin ELP: 2.9 g/dL (ref 2.9–4.4)
Alpha-1-Globulin: 0.3 g/dL (ref 0.0–0.4)
Alpha-2-Globulin: 1.1 g/dL — ABNORMAL HIGH (ref 0.4–1.0)
Beta Globulin: 0.9 g/dL (ref 0.7–1.3)
Gamma Globulin: 1.6 g/dL (ref 0.4–1.8)
Globulin, Total: 4 g/dL — ABNORMAL HIGH (ref 2.2–3.9)
Total Protein ELP: 6.9 g/dL (ref 6.0–8.5)

## 2021-08-22 DIAGNOSIS — R809 Proteinuria, unspecified: Secondary | ICD-10-CM | POA: Diagnosis not present

## 2021-08-22 DIAGNOSIS — M545 Low back pain, unspecified: Secondary | ICD-10-CM | POA: Diagnosis not present

## 2021-08-22 DIAGNOSIS — I10 Essential (primary) hypertension: Secondary | ICD-10-CM | POA: Diagnosis not present

## 2021-08-26 DIAGNOSIS — R937 Abnormal findings on diagnostic imaging of other parts of musculoskeletal system: Secondary | ICD-10-CM

## 2021-08-26 HISTORY — DX: Abnormal findings on diagnostic imaging of other parts of musculoskeletal system: R93.7

## 2021-09-04 ENCOUNTER — Inpatient Hospital Stay: Payer: Medicare Other

## 2021-09-04 ENCOUNTER — Other Ambulatory Visit: Payer: Self-pay | Admitting: Oncology

## 2021-09-04 ENCOUNTER — Inpatient Hospital Stay: Payer: Medicare Other | Attending: Oncology | Admitting: Oncology

## 2021-09-04 VITALS — BP 154/72 | HR 93 | Temp 99.1°F | Resp 16 | Ht 67.0 in | Wt 166.6 lb

## 2021-09-04 DIAGNOSIS — Z79899 Other long term (current) drug therapy: Secondary | ICD-10-CM | POA: Insufficient documentation

## 2021-09-04 DIAGNOSIS — D631 Anemia in chronic kidney disease: Secondary | ICD-10-CM | POA: Diagnosis not present

## 2021-09-04 DIAGNOSIS — D638 Anemia in other chronic diseases classified elsewhere: Secondary | ICD-10-CM

## 2021-09-04 DIAGNOSIS — D649 Anemia, unspecified: Secondary | ICD-10-CM

## 2021-09-04 DIAGNOSIS — N189 Chronic kidney disease, unspecified: Secondary | ICD-10-CM | POA: Diagnosis not present

## 2021-09-04 LAB — IRON AND TIBC
Iron: 24 ug/dL — ABNORMAL LOW (ref 45–182)
Saturation Ratios: 13 % — ABNORMAL LOW (ref 17.9–39.5)
TIBC: 186 ug/dL — ABNORMAL LOW (ref 250–450)
UIBC: 162 ug/dL

## 2021-09-04 LAB — CBC AND DIFFERENTIAL
HCT: 25 — AB (ref 41–53)
Hemoglobin: 8.5 — AB (ref 13.5–17.5)
Neutrophils Absolute: 9.71
Platelets: 675 10*3/uL — AB (ref 150–400)
WBC: 11.7

## 2021-09-04 LAB — BASIC METABOLIC PANEL
BUN: 24 — AB (ref 4–21)
CO2: 23 — AB (ref 13–22)
Chloride: 109 — AB (ref 99–108)
Creatinine: 1.7 — AB (ref 0.6–1.3)
Glucose: 124
Potassium: 3.7 mEq/L (ref 3.5–5.1)
Sodium: 142 (ref 137–147)

## 2021-09-04 LAB — FERRITIN: Ferritin: 190 ng/mL (ref 24–336)

## 2021-09-04 LAB — HEPATIC FUNCTION PANEL
ALT: 23 U/L (ref 10–40)
AST: 27 (ref 14–40)
Alkaline Phosphatase: 93 (ref 25–125)
Bilirubin, Total: 0.4

## 2021-09-04 LAB — COMPREHENSIVE METABOLIC PANEL
Albumin: 3.3 — AB (ref 3.5–5.0)
Calcium: 8.2 — AB (ref 8.7–10.7)

## 2021-09-04 LAB — LACTATE DEHYDROGENASE: LDH: 150 U/L (ref 98–192)

## 2021-09-04 LAB — TSH: TSH: 3.538 u[IU]/mL (ref 0.350–4.500)

## 2021-09-04 LAB — CBC: RBC: 2.82 — AB (ref 3.87–5.11)

## 2021-09-04 NOTE — Progress Notes (Signed)
[facility]  [address] [place],  [place]  [address] [phone]  Clinic Day:  09/04/2021  Referring physician: Angelina Sheriff, MD   HISTORY OF PRESENT ILLNESS:  The patient is a 88 y.o. male  who I recently began seeing for abnormal findings seen on his recent scans.  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  A more concerning issue with the patient with his first visit was that routine labs showed him to be anemic.    He comes in today to go over his labs to determine the possible etiology behind this.  Since his last visit, the patient has been doing okay.  He has had a moderate degree of fatigue, but denies having any overt forms of blood counts.  PHYSICAL EXAM:  Blood pressure (!) 154/72, pulse 93, temperature 99.1 F (37.3 C), resp. rate 16, height 5' 7"  (1.702 m), weight 166 lb 9.6 oz (75.6 kg), SpO2 98 %. Wt Readings from Last 3 Encounters:  09/04/21 166 lb 9.6 oz (75.6 kg)  08/07/21 156 lb 1.6 oz (70.8 kg)  12/27/20 167 lb (75.8 kg)   Body mass index is 26.09 kg/m. Performance status (ECOG): 1 - Symptomatic but completely ambulatory Physical Exam Constitutional:      Appearance: Normal appearance. He is not ill-appearing.  HENT:     Mouth/Throat:     Mouth: Mucous membranes are moist.     Pharynx: Oropharynx is clear. No oropharyngeal exudate or posterior oropharyngeal erythema.  Cardiovascular:     Rate and Rhythm: Normal rate and regular rhythm.     Heart sounds: No murmur heard.    No friction rub. No gallop.  Pulmonary:     Effort: Pulmonary effort is normal. No respiratory distress.     Breath sounds: Normal breath sounds. No wheezing, rhonchi or rales.  Abdominal:     General: Bowel sounds are normal. There is no distension.     Palpations: Abdomen is soft. There is no mass.     Tenderness: There is no abdominal tenderness.   Musculoskeletal:        General: No swelling.     Right lower leg: No edema.     Left lower leg: No edema.  Lymphadenopathy:     Cervical: No cervical adenopathy.     Upper Body:     Right upper body: No supraclavicular or axillary adenopathy.     Left upper body: No supraclavicular or axillary adenopathy.     Lower Body: No right inguinal adenopathy. No left inguinal adenopathy.  Skin:    General: Skin is warm.     Coloration: Skin is not jaundiced.     Findings: No lesion or rash.  Neurological:     General: No focal deficit present.     Mental Status: He is alert and oriented to person, place, and time. Mental status is at baseline.  Psychiatric:        Mood and Affect: Mood normal.        Behavior: Behavior normal.        Thought Content: Thought content normal.   LABS:      Latest Ref Rng & Units 09/04/2021   12:00 AM 08/07/2021   12:00 AM 05/13/2007    5:55 AM  CBC  WBC  11.7     6.9     12.1   Hemoglobin 13.5 - 17.5 8.5  10.2     10.0   Hematocrit 41 - 53 25     30     29.2   Platelets 150 - 400 K/uL 675     480     194      This result is from an external source.      Latest Ref Rng & Units 09/04/2021   12:00 AM 08/07/2021   12:00 AM 05/12/2007    5:25 AM  CMP  Glucose    129   BUN 4 - 21 24     20     17    Creatinine 0.6 - 1.3 1.7     1.1     1.19   Sodium 137 - 147 142     139     141   Potassium 3.5 - 5.1 mEq/L 3.7     3.8     3.6   Chloride 99 - 108 109     107     107   CO2 13 - 22 23     22     26    Calcium 8.7 - 10.7 8.2     8.7     8.1   Alkaline Phos 25 - 125 93     105       AST 14 - 40 27     29       ALT 10 - 40 U/L 23     24          This result is from an external source.   Review Flowsheet       Latest Ref Rng & Units 08/07/2021 09/04/2021  Oncology Labs  Ferritin 24 - 336 ng/mL 95  190   %SAT 17.9 - 39.5 % 14  13   Total Protein ELP 6.0 - 8.5 g/dL 6.9  -  Albumin ELP 2.9 - 4.4 g/dL 2.9  -  Alpha-1 Globulin 0.0 - 0.4 g/dL 0.3  -   Alpha-2 Globulin 0.4 - 1.0 g/dL 1.1  -  Beta Globulin 0.7 - 1.3 g/dL 0.9  -  Gamma Globulin 0.4 - 1.8 g/dL 1.6  -  M-Spike, % Not Observed g/dL Not Observed  -  SPE Interp. - Comment  -  LDH 98 - 192 U/L 137  150     Latest Reference Range & Units 08/07/21 16:25  Iron 45 - 182 ug/dL 33 (L)  UIBC ug/dL 205  TIBC 250 - 450 ug/dL 238 (L)  Saturation Ratios 17.9 - 39.5 % 14 (L)  Ferritin 24 - 336 ng/mL 95  Folate >5.9 ng/mL >40.0  Vitamin B12 180 - 914 pg/mL 594  (L): Data is abnormally low  Latest Reference Range & Units 09/04/21 14:51  Iron 45 - 182 ug/dL 24 (L)  UIBC ug/dL 162  TIBC 250 - 450 ug/dL 186 (L)  Saturation Ratios 17.9 - 39.5 % 13 (L)  Ferritin 24 - 336 ng/mL 190  Transferrin Receptor 12.2 - 27.3 nmol/L 14.1  (L): Data is abnormally low  Latest Reference Range & Units 09/04/21 13:48  LDH 98 - 192 U/L 150    Latest Reference Range & Units Most Recent  TSH 0.350 - 4.500 uIU/mL 3.538 09/04/21 13:48    Latest Reference Range & Units 09/04/21 00:00  Sodium 137 - 147  142 (E)  Potassium 3.5 - 5.1 mEq/L 3.7 (E)  Chloride 99 - 108  109 ! (E)  CO2 13 - 22  23 ! (E)  Glucose  124 (E)  BUN 4 - 21  24 ! (E)  Creatinine 0.6 - 1.3  1.7 ! (E)  Calcium 8.7 - 10.7  8.2 ! (E)  Alkaline Phosphatase 25 - 125  93 (E)  Albumin 3.5 - 5.0  3.3 ! (E)  AST 14 - 40  27 (E)  ALT 10 - 40 U/L 23 (E)  Bilirubin, Total  0.4 (E)  !: Data is abnormal (E): External lab result  ASSESSMENT & PLAN:  An 88 y.o. male whose labs today showing worsening anemia.  When reviewing all of his recent labs, this gentleman does not have any nutritional deficiencies factoring into his anemia.  A repeat iron panel today is once again consistent with anemia of chronic disease, not iron deficiency.  This is based on him having a normal soluble transferrin receptor.  He also had a normal serum protein electrophoresis which makes it highly unlikely an underlying plasma cell dyscrasia is factoring into his  anemia.  His TSH was also normal, which makes it unlikely thyroid disease is factoring into his anemia.  One issue that does appear to be present is renal insufficiency, which could be impacting his bone marrow's ability to make red cells.  Based upon this, I will start this gentleman on monthly Retacrit injections at 20,000 units, with the goal being to get his hemoglobin to/above 10.  I will see him back in 2 months to reassess his anemia to see how well he responded to his upcoming Retacrit injections.  If his hemoglobin remains suboptimal at that time, I would consider doing a bone marrow biopsy to ensure there is no intrinsic marrow disorder, such as myelodysplasia, factoring into his anemia.  The patient understands all the plans discussed today and is in agreement with them.  Renata Gambino Macarthur Critchley, MD

## 2021-09-05 ENCOUNTER — Telehealth: Payer: Self-pay

## 2021-09-05 ENCOUNTER — Encounter: Payer: Self-pay | Admitting: Oncology

## 2021-09-05 DIAGNOSIS — D649 Anemia, unspecified: Secondary | ICD-10-CM | POA: Insufficient documentation

## 2021-09-05 DIAGNOSIS — D631 Anemia in chronic kidney disease: Secondary | ICD-10-CM | POA: Insufficient documentation

## 2021-09-05 HISTORY — DX: Anemia in chronic kidney disease: D63.1

## 2021-09-05 LAB — SOLUBLE TRANSFERRIN RECEPTOR: Transferrin Receptor: 14.1 nmol/L (ref 12.2–27.3)

## 2021-09-05 NOTE — Telephone Encounter (Signed)
Dr Bobby Rumpf reviewed labs, including Hgb 8.5. He states, "He is definitely not iron deficient. He does however need Retacrit injections.I will see him in 2 months".   Latest Reference Range & Units 09/04/21 14:51  Iron 45 - 182 ug/dL 24 (L)  UIBC ug/dL 162  TIBC 250 - 450 ug/dL 186 (L)  Saturation Ratios 17.9 - 39.5 % 13 (L)  Ferritin 24 - 336 ng/mL 190  Transferrin Receptor 12.2 - 27.3 nmol/L 14.1  (L): Data is abnormally low

## 2021-09-07 ENCOUNTER — Encounter: Payer: Self-pay | Admitting: Oncology

## 2021-09-08 ENCOUNTER — Inpatient Hospital Stay: Payer: Medicare Other

## 2021-09-08 ENCOUNTER — Encounter: Payer: Self-pay | Admitting: Oncology

## 2021-09-08 VITALS — BP 171/85 | HR 88 | Temp 98.3°F | Resp 18 | Ht 67.0 in | Wt 165.2 lb

## 2021-09-08 DIAGNOSIS — Z79899 Other long term (current) drug therapy: Secondary | ICD-10-CM | POA: Diagnosis not present

## 2021-09-08 DIAGNOSIS — D631 Anemia in chronic kidney disease: Secondary | ICD-10-CM

## 2021-09-08 DIAGNOSIS — D638 Anemia in other chronic diseases classified elsewhere: Secondary | ICD-10-CM | POA: Diagnosis not present

## 2021-09-08 MED ORDER — EPOETIN ALFA-EPBX 20000 UNIT/ML IJ SOLN
20000.0000 [IU] | Freq: Once | INTRAMUSCULAR | Status: AC
  Administered 2021-09-08: 20000 [IU] via SUBCUTANEOUS
  Filled 2021-09-08: qty 1

## 2021-09-08 NOTE — Patient Instructions (Signed)

## 2021-09-11 DIAGNOSIS — H61303 Acquired stenosis of external ear canal, unspecified, bilateral: Secondary | ICD-10-CM | POA: Diagnosis not present

## 2021-09-11 DIAGNOSIS — H6123 Impacted cerumen, bilateral: Secondary | ICD-10-CM | POA: Diagnosis not present

## 2021-09-11 DIAGNOSIS — H9193 Unspecified hearing loss, bilateral: Secondary | ICD-10-CM | POA: Diagnosis not present

## 2021-09-18 DIAGNOSIS — I10 Essential (primary) hypertension: Secondary | ICD-10-CM | POA: Diagnosis not present

## 2021-09-18 DIAGNOSIS — E7800 Pure hypercholesterolemia, unspecified: Secondary | ICD-10-CM | POA: Diagnosis not present

## 2021-09-18 DIAGNOSIS — K219 Gastro-esophageal reflux disease without esophagitis: Secondary | ICD-10-CM | POA: Diagnosis not present

## 2021-09-19 DIAGNOSIS — Z6825 Body mass index (BMI) 25.0-25.9, adult: Secondary | ICD-10-CM | POA: Diagnosis not present

## 2021-09-19 DIAGNOSIS — G8929 Other chronic pain: Secondary | ICD-10-CM | POA: Diagnosis not present

## 2021-09-19 DIAGNOSIS — I10 Essential (primary) hypertension: Secondary | ICD-10-CM | POA: Diagnosis not present

## 2021-09-19 DIAGNOSIS — D649 Anemia, unspecified: Secondary | ICD-10-CM | POA: Diagnosis not present

## 2021-09-19 DIAGNOSIS — M545 Low back pain, unspecified: Secondary | ICD-10-CM | POA: Diagnosis not present

## 2021-09-19 DIAGNOSIS — R600 Localized edema: Secondary | ICD-10-CM | POA: Diagnosis not present

## 2021-09-29 DIAGNOSIS — G8929 Other chronic pain: Secondary | ICD-10-CM | POA: Diagnosis not present

## 2021-09-29 DIAGNOSIS — M545 Low back pain, unspecified: Secondary | ICD-10-CM | POA: Diagnosis not present

## 2021-09-29 DIAGNOSIS — D649 Anemia, unspecified: Secondary | ICD-10-CM | POA: Diagnosis not present

## 2021-09-29 DIAGNOSIS — R600 Localized edema: Secondary | ICD-10-CM | POA: Diagnosis not present

## 2021-09-29 DIAGNOSIS — I10 Essential (primary) hypertension: Secondary | ICD-10-CM | POA: Diagnosis not present

## 2021-09-30 DIAGNOSIS — R600 Localized edema: Secondary | ICD-10-CM | POA: Diagnosis not present

## 2021-09-30 DIAGNOSIS — D649 Anemia, unspecified: Secondary | ICD-10-CM | POA: Diagnosis not present

## 2021-09-30 DIAGNOSIS — I10 Essential (primary) hypertension: Secondary | ICD-10-CM | POA: Diagnosis not present

## 2021-09-30 DIAGNOSIS — R899 Unspecified abnormal finding in specimens from other organs, systems and tissues: Secondary | ICD-10-CM | POA: Diagnosis not present

## 2021-10-03 ENCOUNTER — Other Ambulatory Visit: Payer: Self-pay | Admitting: Pharmacist

## 2021-10-06 ENCOUNTER — Ambulatory Visit: Payer: Medicare Other

## 2021-10-06 ENCOUNTER — Inpatient Hospital Stay: Payer: Medicare Other

## 2021-10-06 ENCOUNTER — Inpatient Hospital Stay: Payer: Medicare Other | Attending: Oncology

## 2021-10-06 VITALS — BP 153/73 | HR 76 | Temp 97.6°F | Resp 18 | Ht 67.0 in | Wt 156.2 lb

## 2021-10-06 DIAGNOSIS — D631 Anemia in chronic kidney disease: Secondary | ICD-10-CM | POA: Diagnosis not present

## 2021-10-06 DIAGNOSIS — D638 Anemia in other chronic diseases classified elsewhere: Secondary | ICD-10-CM

## 2021-10-06 DIAGNOSIS — R5383 Other fatigue: Secondary | ICD-10-CM | POA: Insufficient documentation

## 2021-10-06 DIAGNOSIS — D649 Anemia, unspecified: Secondary | ICD-10-CM | POA: Diagnosis not present

## 2021-10-06 DIAGNOSIS — N189 Chronic kidney disease, unspecified: Secondary | ICD-10-CM | POA: Insufficient documentation

## 2021-10-06 LAB — TSH: TSH: 4.372 u[IU]/mL (ref 0.350–4.500)

## 2021-10-06 LAB — CBC AND DIFFERENTIAL
HCT: 27 — AB (ref 41–53)
Hemoglobin: 9.2 — AB (ref 13.5–17.5)
Neutrophils Absolute: 5.77
Platelets: 342 10*3/uL (ref 150–400)
WBC: 7.9

## 2021-10-06 LAB — CBC: RBC: 3.09 — AB (ref 3.87–5.11)

## 2021-10-06 MED ORDER — EPOETIN ALFA-EPBX 20000 UNIT/ML IJ SOLN
20000.0000 [IU] | Freq: Once | INTRAMUSCULAR | Status: AC
  Administered 2021-10-06: 20000 [IU] via SUBCUTANEOUS
  Filled 2021-10-06: qty 1

## 2021-10-06 NOTE — Patient Instructions (Signed)

## 2021-10-07 DIAGNOSIS — R600 Localized edema: Secondary | ICD-10-CM | POA: Diagnosis not present

## 2021-10-07 DIAGNOSIS — M25561 Pain in right knee: Secondary | ICD-10-CM | POA: Diagnosis not present

## 2021-10-07 DIAGNOSIS — M1711 Unilateral primary osteoarthritis, right knee: Secondary | ICD-10-CM | POA: Diagnosis not present

## 2021-10-20 DIAGNOSIS — N3289 Other specified disorders of bladder: Secondary | ICD-10-CM | POA: Diagnosis not present

## 2021-10-20 DIAGNOSIS — C61 Malignant neoplasm of prostate: Secondary | ICD-10-CM | POA: Diagnosis not present

## 2021-10-24 DIAGNOSIS — Z23 Encounter for immunization: Secondary | ICD-10-CM | POA: Diagnosis not present

## 2021-10-24 DIAGNOSIS — D649 Anemia, unspecified: Secondary | ICD-10-CM | POA: Diagnosis not present

## 2021-10-24 DIAGNOSIS — Z6823 Body mass index (BMI) 23.0-23.9, adult: Secondary | ICD-10-CM | POA: Diagnosis not present

## 2021-10-24 DIAGNOSIS — H409 Unspecified glaucoma: Secondary | ICD-10-CM | POA: Diagnosis not present

## 2021-10-24 DIAGNOSIS — G8929 Other chronic pain: Secondary | ICD-10-CM | POA: Diagnosis not present

## 2021-10-24 DIAGNOSIS — M5442 Lumbago with sciatica, left side: Secondary | ICD-10-CM | POA: Diagnosis not present

## 2021-10-24 DIAGNOSIS — Z1331 Encounter for screening for depression: Secondary | ICD-10-CM | POA: Diagnosis not present

## 2021-10-24 DIAGNOSIS — I10 Essential (primary) hypertension: Secondary | ICD-10-CM | POA: Diagnosis not present

## 2021-10-24 DIAGNOSIS — J4520 Mild intermittent asthma, uncomplicated: Secondary | ICD-10-CM | POA: Diagnosis not present

## 2021-11-03 NOTE — Progress Notes (Signed)
[facility]  [address] [place],  [place]  [address] [phone]  Clinic Day:  09/04/2021  Referring physician: Angelina Sheriff, MD   HISTORY OF PRESENT ILLNESS:  The patient is a 88 y.o. male  who I recently began seeing for abnormal findings seen on his recent scans.  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  A more concerning issue with the patient with his first visit was that routine labs showed him to be anemic.    He comes in today to go over his labs to determine the possible etiology behind this.  Since his last visit, the patient has been doing okay.  He has had a moderate degree of fatigue, but denies having any overt forms of blood counts.  PHYSICAL EXAM:  There were no vitals taken for this visit. Wt Readings from Last 3 Encounters:  10/06/21 156 lb 4 oz (70.9 kg)  09/08/21 165 lb 4 oz (75 kg)  09/04/21 166 lb 9.6 oz (75.6 kg)   There is no height or weight on file to calculate BMI. Performance status (ECOG): 1 - Symptomatic but completely ambulatory Physical Exam Constitutional:      Appearance: Normal appearance. He is not ill-appearing.  HENT:     Mouth/Throat:     Mouth: Mucous membranes are moist.     Pharynx: Oropharynx is clear. No oropharyngeal exudate or posterior oropharyngeal erythema.  Cardiovascular:     Rate and Rhythm: Normal rate and regular rhythm.     Heart sounds: No murmur heard.    No friction rub. No gallop.  Pulmonary:     Effort: Pulmonary effort is normal. No respiratory distress.     Breath sounds: Normal breath sounds. No wheezing, rhonchi or rales.  Abdominal:     General: Bowel sounds are normal. There is no distension.     Palpations: Abdomen is soft. There is no mass.     Tenderness: There is no abdominal tenderness.  Musculoskeletal:        General: No swelling.     Right lower leg: No edema.     Left  lower leg: No edema.  Lymphadenopathy:     Cervical: No cervical adenopathy.     Upper Body:     Right upper body: No supraclavicular or axillary adenopathy.     Left upper body: No supraclavicular or axillary adenopathy.     Lower Body: No right inguinal adenopathy. No left inguinal adenopathy.  Skin:    General: Skin is warm.     Coloration: Skin is not jaundiced.     Findings: No lesion or rash.  Neurological:     General: No focal deficit present.     Mental Status: He is alert and oriented to person, place, and time. Mental status is at baseline.  Psychiatric:        Mood and Affect: Mood normal.        Behavior: Behavior normal.        Thought Content: Thought content normal.   LABS:      Latest Ref Rng & Units 10/06/2021   12:00 AM 09/04/2021   12:00 AM 08/07/2021   12:00 AM  CBC  WBC  7.9     11.7     6.9      Hemoglobin 13.5 - 17.5 9.2     8.5     10.2  Hematocrit 41 - 53 27     25     30       Platelets 150 - 400 K/uL 342     675     480         This result is from an external source.       Latest Ref Rng & Units 09/04/2021   12:00 AM 08/07/2021   12:00 AM 05/12/2007    5:25 AM  CMP  Glucose    129   BUN 4 - 21 24     20     17    Creatinine 0.6 - 1.3 1.7     1.1     1.19   Sodium 137 - 147 142     139     141   Potassium 3.5 - 5.1 mEq/L 3.7     3.8     3.6   Chloride 99 - 108 109     107     107   CO2 13 - 22 23     22     26    Calcium 8.7 - 10.7 8.2     8.7     8.1   Alkaline Phos 25 - 125 93     105       AST 14 - 40 27     29       ALT 10 - 40 U/L 23     24          This result is from an external source.    Review Flowsheet       Latest Ref Rng & Units 08/07/2021 09/04/2021  Oncology Labs  Ferritin 24 - 336 ng/mL 95  190   %SAT 17.9 - 39.5 % 14  13   Total Protein ELP 6.0 - 8.5 g/dL 6.9  -  Albumin ELP 2.9 - 4.4 g/dL 2.9  -  Alpha-1 Globulin 0.0 - 0.4 g/dL 0.3  -  Alpha-2 Globulin 0.4 - 1.0 g/dL 1.1  -  Beta Globulin 0.7 - 1.3 g/dL 0.9  -   Gamma Globulin 0.4 - 1.8 g/dL 1.6  -  M-Spike, % Not Observed g/dL Not Observed  -  SPE Interp. - Comment  -  LDH 98 - 192 U/L 137  150     Latest Reference Range & Units 08/07/21 16:25  Iron 45 - 182 ug/dL 33 (L)  UIBC ug/dL 205  TIBC 250 - 450 ug/dL 238 (L)  Saturation Ratios 17.9 - 39.5 % 14 (L)  Ferritin 24 - 336 ng/mL 95  Folate >5.9 ng/mL >40.0  Vitamin B12 180 - 914 pg/mL 594  (L): Data is abnormally low  Latest Reference Range & Units 09/04/21 14:51  Iron 45 - 182 ug/dL 24 (L)  UIBC ug/dL 162  TIBC 250 - 450 ug/dL 186 (L)  Saturation Ratios 17.9 - 39.5 % 13 (L)  Ferritin 24 - 336 ng/mL 190  Transferrin Receptor 12.2 - 27.3 nmol/L 14.1  (L): Data is abnormally low  Latest Reference Range & Units 09/04/21 13:48  LDH 98 - 192 U/L 150    Latest Reference Range & Units Most Recent  TSH 0.350 - 4.500 uIU/mL 3.538 09/04/21 13:48    Latest Reference Range & Units 09/04/21 00:00  Sodium 137 - 147  142 (E)  Potassium 3.5 - 5.1 mEq/L 3.7 (E)  Chloride 99 - 108  109 ! (E)  CO2 13 - 22  23 ! (E)  Glucose  124 (E)  BUN 4 - 21  24 ! (E)  Creatinine 0.6 - 1.3  1.7 ! (E)  Calcium 8.7 - 10.7  8.2 ! (E)  Alkaline Phosphatase 25 - 125  93 (E)  Albumin 3.5 - 5.0  3.3 ! (E)  AST 14 - 40  27 (E)  ALT 10 - 40 U/L 23 (E)  Bilirubin, Total  0.4 (E)  !: Data is abnormal (E): External lab result  ASSESSMENT & PLAN:  An 88 y.o. male whose labs today showing worsening anemia.  When reviewing all of his recent labs, this gentleman does not have any nutritional deficiencies factoring into his anemia.  A repeat iron panel today is once again consistent with anemia of chronic disease, not iron deficiency.  This is based on him having a normal soluble transferrin receptor.  He also had a normal serum protein electrophoresis which makes it highly unlikely an underlying plasma cell dyscrasia is factoring into his anemia.  His TSH was also normal, which makes it unlikely thyroid disease is  factoring into his anemia.  One issue that does appear to be present is renal insufficiency, which could be impacting his bone marrow's ability to make red cells.  Based upon this, I will start this gentleman on monthly Retacrit injections at 20,000 units, with the goal being to get his hemoglobin to/above 10.  I will see him back in 2 months to reassess his anemia to see how well he responded to his upcoming Retacrit injections.  If his hemoglobin remains suboptimal at that time, I would consider doing a bone marrow biopsy to ensure there is no intrinsic marrow disorder, such as myelodysplasia, factoring into his anemia.  The patient understands all the plans discussed today and is in agreement with them.  Ulices Maack Macarthur Critchley, MD

## 2021-11-04 ENCOUNTER — Inpatient Hospital Stay: Payer: Medicare Other | Attending: Oncology | Admitting: Oncology

## 2021-11-04 ENCOUNTER — Inpatient Hospital Stay: Payer: Medicare Other

## 2021-11-04 ENCOUNTER — Other Ambulatory Visit: Payer: Self-pay | Admitting: Oncology

## 2021-11-04 VITALS — BP 163/77 | HR 88 | Temp 98.0°F | Resp 16 | Ht 67.0 in | Wt 158.9 lb

## 2021-11-04 DIAGNOSIS — D638 Anemia in other chronic diseases classified elsewhere: Secondary | ICD-10-CM | POA: Diagnosis not present

## 2021-11-04 DIAGNOSIS — D649 Anemia, unspecified: Secondary | ICD-10-CM | POA: Insufficient documentation

## 2021-11-04 DIAGNOSIS — Z79899 Other long term (current) drug therapy: Secondary | ICD-10-CM | POA: Diagnosis not present

## 2021-11-04 DIAGNOSIS — D631 Anemia in chronic kidney disease: Secondary | ICD-10-CM | POA: Diagnosis not present

## 2021-11-04 DIAGNOSIS — N189 Chronic kidney disease, unspecified: Secondary | ICD-10-CM | POA: Diagnosis not present

## 2021-11-04 LAB — CBC AND DIFFERENTIAL
HCT: 29 — AB (ref 41–53)
Hemoglobin: 10 — AB (ref 13.5–17.5)
Neutrophils Absolute: 6.08
Platelets: 355 10*3/uL (ref 150–400)
WBC: 8

## 2021-11-04 LAB — CBC: RBC: 3.32 — AB (ref 3.87–5.11)

## 2021-11-04 LAB — TSH: TSH: 4.078 u[IU]/mL (ref 0.350–4.500)

## 2021-11-04 MED ORDER — AZITHROMYCIN 250 MG PO TABS: 500.0000 mg | ORAL_TABLET | Freq: Every day | ORAL | Status: DC

## 2021-11-04 MED ORDER — AZITHROMYCIN 250 MG PO TABS: 250.0000 mg | ORAL_TABLET | Freq: Every day | ORAL | Status: DC

## 2021-11-28 ENCOUNTER — Encounter: Payer: Self-pay | Admitting: Oncology

## 2021-12-05 ENCOUNTER — Inpatient Hospital Stay: Payer: Medicare Other

## 2021-12-05 ENCOUNTER — Inpatient Hospital Stay: Payer: Medicare Other | Attending: Oncology

## 2021-12-05 VITALS — BP 185/76 | HR 71 | Temp 97.9°F | Resp 14 | Ht 67.0 in | Wt 158.1 lb

## 2021-12-05 DIAGNOSIS — N289 Disorder of kidney and ureter, unspecified: Secondary | ICD-10-CM | POA: Insufficient documentation

## 2021-12-05 DIAGNOSIS — D649 Anemia, unspecified: Secondary | ICD-10-CM | POA: Diagnosis not present

## 2021-12-05 DIAGNOSIS — D631 Anemia in chronic kidney disease: Secondary | ICD-10-CM | POA: Insufficient documentation

## 2021-12-05 DIAGNOSIS — N189 Chronic kidney disease, unspecified: Secondary | ICD-10-CM | POA: Insufficient documentation

## 2021-12-05 MED ORDER — EPOETIN ALFA-EPBX 20000 UNIT/ML IJ SOLN
20000.0000 [IU] | Freq: Once | INTRAMUSCULAR | Status: AC
  Administered 2021-12-05: 20000 [IU] via SUBCUTANEOUS
  Filled 2021-12-05: qty 1

## 2022-01-02 ENCOUNTER — Inpatient Hospital Stay: Payer: Medicare Other

## 2022-01-02 ENCOUNTER — Other Ambulatory Visit: Payer: Medicare Other

## 2022-01-02 ENCOUNTER — Inpatient Hospital Stay: Payer: Medicare Other | Attending: Oncology

## 2022-01-02 VITALS — BP 170/77 | HR 74 | Temp 97.8°F | Resp 18 | Ht 67.0 in | Wt 157.4 lb

## 2022-01-02 DIAGNOSIS — Z79899 Other long term (current) drug therapy: Secondary | ICD-10-CM | POA: Diagnosis not present

## 2022-01-02 DIAGNOSIS — D638 Anemia in other chronic diseases classified elsewhere: Secondary | ICD-10-CM

## 2022-01-02 DIAGNOSIS — N189 Chronic kidney disease, unspecified: Secondary | ICD-10-CM | POA: Insufficient documentation

## 2022-01-02 DIAGNOSIS — D631 Anemia in chronic kidney disease: Secondary | ICD-10-CM

## 2022-01-02 DIAGNOSIS — D649 Anemia, unspecified: Secondary | ICD-10-CM | POA: Insufficient documentation

## 2022-01-02 DIAGNOSIS — N289 Disorder of kidney and ureter, unspecified: Secondary | ICD-10-CM | POA: Diagnosis not present

## 2022-01-02 LAB — CBC AND DIFFERENTIAL
HCT: 32 — AB (ref 41–53)
Hemoglobin: 10.8 — AB (ref 13.5–17.5)
Neutrophils Absolute: 4.22
Platelets: 315 10*3/uL (ref 150–400)
WBC: 6.8

## 2022-01-02 LAB — CBC: RBC: 3.71 — AB (ref 3.87–5.11)

## 2022-01-02 MED ORDER — EPOETIN ALFA-EPBX 20000 UNIT/ML IJ SOLN
20000.0000 [IU] | Freq: Once | INTRAMUSCULAR | Status: AC
  Administered 2022-01-02: 20000 [IU] via SUBCUTANEOUS
  Filled 2022-01-02: qty 1

## 2022-01-02 NOTE — Patient Instructions (Signed)

## 2022-01-26 ENCOUNTER — Ambulatory Visit: Payer: Medicare Other | Admitting: Internal Medicine

## 2022-01-26 ENCOUNTER — Encounter: Payer: Self-pay | Admitting: Oncology

## 2022-01-26 ENCOUNTER — Encounter: Payer: Self-pay | Admitting: Internal Medicine

## 2022-01-26 VITALS — BP 124/70 | HR 84 | Temp 97.9°F | Resp 18 | Ht 68.0 in | Wt 158.2 lb

## 2022-01-26 DIAGNOSIS — J4520 Mild intermittent asthma, uncomplicated: Secondary | ICD-10-CM

## 2022-01-26 DIAGNOSIS — J45909 Unspecified asthma, uncomplicated: Secondary | ICD-10-CM

## 2022-01-26 DIAGNOSIS — J302 Other seasonal allergic rhinitis: Secondary | ICD-10-CM

## 2022-01-26 DIAGNOSIS — I10 Essential (primary) hypertension: Secondary | ICD-10-CM | POA: Diagnosis not present

## 2022-01-26 HISTORY — DX: Unspecified asthma, uncomplicated: J45.909

## 2022-01-26 HISTORY — DX: Other seasonal allergic rhinitis: J30.2

## 2022-01-26 MED ORDER — MONTELUKAST SODIUM 10 MG PO TABS: 10.0000 mg | ORAL_TABLET | Freq: Every day | ORAL | 2 refills | Status: DC

## 2022-01-26 MED ORDER — FLUTICASONE PROPIONATE 50 MCG/ACT NA SUSP: NASAL | 2 refills | Status: DC

## 2022-01-26 MED ORDER — FLUTICASONE-SALMETEROL 250-50 MCG/ACT IN AEPB: 1.0000 | INHALATION_SPRAY | Freq: Two times a day (BID) | RESPIRATORY_TRACT | 6 refills | Status: DC

## 2022-01-26 MED ORDER — FEXOFENADINE HCL 60 MG PO TABS: 60.0000 mg | ORAL_TABLET | Freq: Two times a day (BID) | ORAL | 1 refills | Status: DC

## 2022-01-26 NOTE — Assessment & Plan Note (Signed)
controlled 

## 2022-01-26 NOTE — Assessment & Plan Note (Signed)
He has stuffy nose and get worse this time of the year.

## 2022-01-26 NOTE — Assessment & Plan Note (Signed)
Controlled.  

## 2022-01-26 NOTE — Progress Notes (Addendum)
   Office Visit  Subjective   Patient ID: Jack Turner   DOB: [birth year]   Age: 89 y.o.   MRN: [id number]   Chief Complaint Chief Complaint  Patient presents with   Follow-up   Hypertension     History of Present Illness 89 years old male is here for follow up. He is seeing Dr. Bobby Rumpf for chronic anemia and last Hb was above 10. He has appointment with Dr. Bobby Rumpf next week.  He also has hypertension and his blood pressure is controlled.  He has asthma and says his insurance will not cover symbicort, insurance wanted to change to Advair rather.  He has seasonal allergies and is not using anything. He is asking for something.   Past Medical History Past Medical History:  Diagnosis Date   Anemia of chronic disorder    Arthritis, degenerative    Ataxia    GERD (gastroesophageal reflux disease)    History of prostate cancer    Hypercholesterolemia    Hypertension    Overweight      Allergies Allergies  Allergen Reactions   Hydrochlorothiazide Other (See Comments)    Caused renal insufficiency     Review of Systems Review of Systems  Constitutional: Negative.   HENT:  Positive for congestion.   Respiratory: Negative.    Cardiovascular: Negative.   Gastrointestinal: Negative.   Neurological: Negative.        Objective:    Vitals BP 124/70 (BP Location: Left Arm, Patient Position: Sitting, Cuff Size: Normal)   Pulse 84   Temp 97.9 F (36.6 C)   Resp 18   Ht '5\' 8"'$  (1.727 m)   Wt 158 lb 4 oz (71.8 kg)   SpO2 95%   BMI 24.06 kg/m    Physical Examination Physical Exam Constitutional:      Appearance: Normal appearance. He is normal weight.  HENT:     Head: Normocephalic and atraumatic.  Eyes:     Extraocular Movements: Extraocular movements intact.     Pupils: Pupils are equal, round, and reactive to light.  Cardiovascular:     Rate and Rhythm: Normal rate and regular rhythm.     Heart sounds: Normal heart sounds.  Pulmonary:     Effort: Pulmonary  effort is normal.     Breath sounds: Normal breath sounds.  Abdominal:     General: Bowel sounds are normal.     Palpations: Abdomen is soft.  Neurological:     General: No focal deficit present.     Mental Status: He is alert and oriented to person, place, and time.        Assessment & Plan:   Hypertension Controlled.  Asthma controlled  Seasonal allergies He has stuffy nose and get worse this time of the year.    Return in about 6 months (around 07/27/2022).   Garwin Brothers, MD

## 2022-01-29 NOTE — Progress Notes (Signed)
[place]  [address] [place],  [place]  [address] [phone]  Clinic Day:  01/30/2022  Referring physician: Garwin Brothers, MD   HISTORY OF PRESENT ILLNESS:  The patient is a 89 y.o. male with anemia secondary to chronic renal insufficiency.  He occasionally receives Retacrit therapy to keep his hemoglobin over 10.  Over these past months, his hemoglobin has consistently been above 10.  He comes in today for routine follow-up.  Since his last visit, the patient has been doing well.  He denies having increased fatigue or any overt forms of blood loss Which Concerns Him for progressive anemia.  PHYSICAL EXAM:  Blood pressure (!) 180/71, pulse 80, temperature 98.5 F (36.9 C), resp. rate 16, height '5\' 7"'$  (1.702 m), weight 157 lb 11.2 oz (71.5 kg), SpO2 96 %. Wt Readings from Last 3 Encounters:  01/30/22 157 lb 11.2 oz (71.5 kg)  01/26/22 158 lb 4 oz (71.8 kg)  01/02/22 157 lb 6.4 oz (71.4 kg)   Body mass index is 24.7 kg/m. Performance status (ECOG): 1 - Symptomatic but completely ambulatory Physical Exam Constitutional:      Appearance: Normal appearance. He is not ill-appearing.  HENT:     Mouth/Throat:     Mouth: Mucous membranes are moist.     Pharynx: Oropharynx is clear. No oropharyngeal exudate or posterior oropharyngeal erythema.  Cardiovascular:     Rate and Rhythm: Normal rate and regular rhythm.     Heart sounds: No murmur heard.    No friction rub. No gallop.  Pulmonary:     Effort: Pulmonary effort is normal. No respiratory distress.     Breath sounds: Normal breath sounds. No wheezing, rhonchi or rales.  Abdominal:     General: Bowel sounds are normal. There is no distension.     Palpations: Abdomen is soft. There is no mass.     Tenderness: There is no abdominal tenderness.  Musculoskeletal:        General: No swelling.     Right lower leg: No edema.     Left lower leg: No edema.  Lymphadenopathy:     Cervical: No  cervical adenopathy.     Upper Body:     Right upper body: No supraclavicular or axillary adenopathy.     Left upper body: No supraclavicular or axillary adenopathy.     Lower Body: No right inguinal adenopathy. No left inguinal adenopathy.  Skin:    General: Skin is warm.     Coloration: Skin is not jaundiced.     Findings: No lesion or rash.  Neurological:     General: No focal deficit present.     Mental Status: He is alert and oriented to person, place, and time. Mental status is at baseline.  Psychiatric:        Mood and Affect: Mood normal.        Behavior: Behavior normal.        Thought Content: Thought content normal.    LABS:       Latest Ref Rng & Units 09/04/2021   12:00 AM 08/07/2021   12:00 AM 05/12/2007    5:25 AM  CMP  Glucose    129   BUN 4 - '21 24     20     17   '$ Creatinine 0.6 - 1.3 1.7     1.1     1.19   Sodium 137 - 147 142     139  141   Potassium 3.5 - 5.1 mEq/L 3.7     3.8     3.6   Chloride 99 - 108 109     107     107   CO2 13 - '22 23     22     26   '$ Calcium 8.7 - 10.7 8.2     8.7     8.1   Alkaline Phos 25 - 125 93     105       AST 14 - 40 27     29       ALT 10 - 40 U/L 23     24          This result is from an external source.    Latest Reference Range & Units 01/30/22 13:21  Iron 45 - 182 ug/dL 51  UIBC ug/dL 207  TIBC 250 - 450 ug/dL 258  Saturation Ratios 17.9 - 39.5 % 20  Ferritin 24 - 336 ng/mL 58   ASSESSMENT & PLAN:  An 89 y.o. male with anemia secondary to chronic renal insufficiency.  I am pleased as this gentleman's hemoglobin remains well above 10.  Based upon this, red cell shot therapy is not needed at this time.  Moving forward, his CBC will be checked once every 2 months.  I will see him back in 4 months for repeat clinical assessment. The patient understands all the plans discussed today and is in agreement with them.  Evynn Boutelle Macarthur Critchley, MD

## 2022-01-30 ENCOUNTER — Inpatient Hospital Stay (INDEPENDENT_AMBULATORY_CARE_PROVIDER_SITE_OTHER): Payer: Medicare Other | Admitting: Oncology

## 2022-01-30 ENCOUNTER — Other Ambulatory Visit: Payer: Self-pay | Admitting: Oncology

## 2022-01-30 ENCOUNTER — Inpatient Hospital Stay: Payer: Medicare Other

## 2022-01-30 ENCOUNTER — Inpatient Hospital Stay: Payer: Medicare Other | Attending: Oncology

## 2022-01-30 ENCOUNTER — Other Ambulatory Visit: Payer: Medicare Other

## 2022-01-30 ENCOUNTER — Encounter: Payer: Self-pay | Admitting: Oncology

## 2022-01-30 VITALS — BP 180/71 | HR 80 | Temp 98.5°F | Resp 16 | Ht 67.0 in | Wt 157.7 lb

## 2022-01-30 DIAGNOSIS — N189 Chronic kidney disease, unspecified: Secondary | ICD-10-CM

## 2022-01-30 DIAGNOSIS — N289 Disorder of kidney and ureter, unspecified: Secondary | ICD-10-CM | POA: Insufficient documentation

## 2022-01-30 DIAGNOSIS — D638 Anemia in other chronic diseases classified elsewhere: Secondary | ICD-10-CM | POA: Diagnosis not present

## 2022-01-30 DIAGNOSIS — Z79899 Other long term (current) drug therapy: Secondary | ICD-10-CM | POA: Insufficient documentation

## 2022-01-30 DIAGNOSIS — D631 Anemia in chronic kidney disease: Secondary | ICD-10-CM | POA: Insufficient documentation

## 2022-01-30 DIAGNOSIS — D649 Anemia, unspecified: Secondary | ICD-10-CM | POA: Diagnosis not present

## 2022-01-30 LAB — CBC AND DIFFERENTIAL
HCT: 32 — AB (ref 41–53)
Hemoglobin: 10.7 — AB (ref 13.5–17.5)
Neutrophils Absolute: 7.08
Platelets: 346 10*3/uL (ref 150–400)
WBC: 9.2

## 2022-01-30 LAB — HEPATIC FUNCTION PANEL
ALT: 17 U/L (ref 10–40)
AST: 30 (ref 14–40)
Alkaline Phosphatase: 80 (ref 25–125)
Bilirubin, Total: 0.5

## 2022-01-30 LAB — BASIC METABOLIC PANEL
BUN: 26 — AB (ref 4–21)
CO2: 23 — AB (ref 13–22)
Chloride: 108 (ref 99–108)
Creatinine: 1.1 (ref 0.6–1.3)
Glucose: 109
Potassium: 3.9 mEq/L (ref 3.5–5.1)
Sodium: 139 (ref 137–147)

## 2022-01-30 LAB — IRON AND TIBC
Iron: 51 ug/dL (ref 45–182)
Saturation Ratios: 20 % (ref 17.9–39.5)
TIBC: 258 ug/dL (ref 250–450)
UIBC: 207 ug/dL

## 2022-01-30 LAB — FERRITIN: Ferritin: 58 ng/mL (ref 24–336)

## 2022-01-30 LAB — TSH: TSH: 4.373 u[IU]/mL (ref 0.350–4.500)

## 2022-01-30 LAB — CBC: RBC: 3.66 — AB (ref 3.87–5.11)

## 2022-01-30 LAB — COMPREHENSIVE METABOLIC PANEL
Albumin: 3.8 (ref 3.5–5.0)
Calcium: 8.4 — AB (ref 8.7–10.7)

## 2022-01-31 ENCOUNTER — Encounter: Payer: Self-pay | Admitting: Oncology

## 2022-02-04 ENCOUNTER — Other Ambulatory Visit: Payer: Medicare Other

## 2022-02-04 ENCOUNTER — Ambulatory Visit: Payer: Medicare Other | Admitting: Oncology

## 2022-03-12 DIAGNOSIS — H9193 Unspecified hearing loss, bilateral: Secondary | ICD-10-CM | POA: Diagnosis not present

## 2022-03-12 DIAGNOSIS — H6123 Impacted cerumen, bilateral: Secondary | ICD-10-CM | POA: Diagnosis not present

## 2022-03-12 DIAGNOSIS — H61303 Acquired stenosis of external ear canal, unspecified, bilateral: Secondary | ICD-10-CM | POA: Diagnosis not present

## 2022-03-31 ENCOUNTER — Inpatient Hospital Stay: Payer: Medicare Other | Attending: Oncology

## 2022-03-31 DIAGNOSIS — D638 Anemia in other chronic diseases classified elsewhere: Secondary | ICD-10-CM

## 2022-03-31 DIAGNOSIS — N289 Disorder of kidney and ureter, unspecified: Secondary | ICD-10-CM | POA: Insufficient documentation

## 2022-03-31 DIAGNOSIS — Z79899 Other long term (current) drug therapy: Secondary | ICD-10-CM | POA: Insufficient documentation

## 2022-03-31 DIAGNOSIS — D649 Anemia, unspecified: Secondary | ICD-10-CM

## 2022-03-31 DIAGNOSIS — D631 Anemia in chronic kidney disease: Secondary | ICD-10-CM | POA: Diagnosis not present

## 2022-03-31 LAB — CBC WITH DIFFERENTIAL (CANCER CENTER ONLY)
Abs Immature Granulocytes: 0.04 10*3/uL (ref 0.00–0.07)
Basophils Absolute: 0.1 10*3/uL (ref 0.0–0.1)
Basophils Relative: 1 %
Eosinophils Absolute: 0.2 10*3/uL (ref 0.0–0.5)
Eosinophils Relative: 2 %
HCT: 33.1 % — ABNORMAL LOW (ref 39.0–52.0)
Hemoglobin: 10.7 g/dL — ABNORMAL LOW (ref 13.0–17.0)
Immature Granulocytes: 1 %
Lymphocytes Relative: 18 %
Lymphs Abs: 1.6 10*3/uL (ref 0.7–4.0)
MCH: 29.9 pg (ref 26.0–34.0)
MCHC: 32.3 g/dL (ref 30.0–36.0)
MCV: 92.5 fL (ref 80.0–100.0)
Monocytes Absolute: 0.6 10*3/uL (ref 0.1–1.0)
Monocytes Relative: 7 %
Neutro Abs: 6.4 10*3/uL (ref 1.7–7.7)
Neutrophils Relative %: 71 %
Platelet Count: 299 10*3/uL (ref 150–400)
RBC: 3.58 MIL/uL — ABNORMAL LOW (ref 4.22–5.81)
RDW: 14.3 % (ref 11.5–15.5)
WBC Count: 8.9 10*3/uL (ref 4.0–10.5)
nRBC: 0 % (ref 0.0–0.2)

## 2022-03-31 LAB — TSH: TSH: 6.664 u[IU]/mL — ABNORMAL HIGH (ref 0.350–4.500)

## 2022-04-24 ENCOUNTER — Other Ambulatory Visit: Payer: Self-pay | Admitting: Internal Medicine

## 2022-04-24 DIAGNOSIS — C61 Malignant neoplasm of prostate: Secondary | ICD-10-CM | POA: Diagnosis not present

## 2022-04-24 DIAGNOSIS — N3289 Other specified disorders of bladder: Secondary | ICD-10-CM | POA: Diagnosis not present

## 2022-05-25 DIAGNOSIS — H04123 Dry eye syndrome of bilateral lacrimal glands: Secondary | ICD-10-CM | POA: Diagnosis not present

## 2022-05-25 DIAGNOSIS — H18413 Arcus senilis, bilateral: Secondary | ICD-10-CM | POA: Diagnosis not present

## 2022-05-25 DIAGNOSIS — H18593 Other hereditary corneal dystrophies, bilateral: Secondary | ICD-10-CM | POA: Diagnosis not present

## 2022-05-25 DIAGNOSIS — H401133 Primary open-angle glaucoma, bilateral, severe stage: Secondary | ICD-10-CM | POA: Diagnosis not present

## 2022-05-31 NOTE — Progress Notes (Unsigned)
[facility]  [address] [place],  [place]  [address] [phone]  Clinic Day:  06/01/2022  Referring physician: Eloisa Northern, MD   HISTORY OF PRESENT ILLNESS:  The patient is a 89 y.o. male with anemia secondary to chronic renal insufficiency.  Over these past months, his hemoglobin has consistently been above 10 to where red cell shot therapy has not been necessary.  He comes in today for routine follow-up.  Since his last visit, the patient has been doing well.  He denies having increased fatigue or any overt forms of blood loss which concern him for progressive anemia.  PHYSICAL EXAM:  Blood pressure (!) 188/88, pulse 70, temperature (!) 97.5 F (36.4 C), temperature source Oral, resp. rate 18, height 5\' 7"  (1.702 m), weight 159 lb 8 oz (72.3 kg), SpO2 96 %. Wt Readings from Last 3 Encounters:  06/01/22 159 lb 8 oz (72.3 kg)  01/30/22 157 lb 11.2 oz (71.5 kg)  01/26/22 158 lb 4 oz (71.8 kg)   Body mass index is 24.98 kg/m. Performance status (ECOG): 1 - Symptomatic but completely ambulatory Physical Exam Constitutional:      Appearance: Normal appearance. He is not ill-appearing.  HENT:     Mouth/Throat:     Mouth: Mucous membranes are moist.     Pharynx: Oropharynx is clear. No oropharyngeal exudate or posterior oropharyngeal erythema.  Cardiovascular:     Rate and Rhythm: Normal rate and regular rhythm.     Heart sounds: No murmur heard.    No friction rub. No gallop.  Pulmonary:     Effort: Pulmonary effort is normal. No respiratory distress.     Breath sounds: Normal breath sounds. No wheezing, rhonchi or rales.  Abdominal:     General: Bowel sounds are normal. There is no distension.     Palpations: Abdomen is soft. There is no mass.     Tenderness: There is no abdominal tenderness.  Musculoskeletal:        General: No swelling.     Right lower leg: No edema.     Left lower leg: No edema.  Lymphadenopathy:     Cervical: No  cervical adenopathy.     Upper Body:     Right upper body: No supraclavicular or axillary adenopathy.     Left upper body: No supraclavicular or axillary adenopathy.     Lower Body: No right inguinal adenopathy. No left inguinal adenopathy.  Skin:    General: Skin is warm.     Coloration: Skin is not jaundiced.     Findings: No lesion or rash.  Neurological:     General: No focal deficit present.     Mental Status: He is alert and oriented to person, place, and time. Mental status is at baseline.  Psychiatric:        Mood and Affect: Mood normal.        Behavior: Behavior normal.        Thought Content: Thought content normal.    LABS:    Latest Reference Range & Units 06/01/22 00:00  WBC  7.4 (E)  RBC 3.87 - 5.11  3.44 ! (E)  Hemoglobin 13.5 - 17.5  10.6 ! (E)  HCT 41 - 53  31 ! (E)  Platelets 150 - 400 K/uL 292 (E)  !: Data is abnormal (E): External lab result     Latest Ref Rng & Units 06/01/2022    1:58 PM 01/30/2022   12:00 AM 09/04/2021  12:00 AM  CMP  Glucose 70 - 99 mg/dL 409     BUN 8 - 23 mg/dL 26  26     24       Creatinine 0.61 - 1.24 mg/dL 8.11  1.1     1.7      Sodium 135 - 145 mmol/L 139  139     142      Potassium 3.5 - 5.1 mmol/L 3.9  3.9     3.7      Chloride 98 - 111 mmol/L 109  108     109      CO2 22 - 32 mmol/L 22  23     23       Calcium 8.9 - 10.3 mg/dL 8.3  8.4     8.2      Total Protein 6.5 - 8.1 g/dL 7.2     Total Bilirubin 0.3 - 1.2 mg/dL 0.5     Alkaline Phos 38 - 126 U/L 78  80     93      AST 15 - 41 U/L 23  30     27       ALT 0 - 44 U/L 16  17     23          This result is from an external source.   ASSESSMENT & PLAN:  An 89 y.o. male with anemia secondary to chronic renal insufficiency.  I am pleased as this gentleman's hemoglobin remains well above 10.  Based upon this, red cell shot therapy remains unnecessary at this time.  As his hemoglobin has held stable and he is clinically doing well, I will see him back in 6 months for repeat  clinical assessment. The patient understands all the plans discussed today and is in agreement with them.  Vannary Greening Kirby Funk, MD

## 2022-06-01 ENCOUNTER — Other Ambulatory Visit: Payer: Self-pay | Admitting: Oncology

## 2022-06-01 ENCOUNTER — Inpatient Hospital Stay: Payer: Medicare Other | Attending: Oncology | Admitting: Oncology

## 2022-06-01 ENCOUNTER — Inpatient Hospital Stay: Payer: Medicare Other

## 2022-06-01 VITALS — BP 188/88 | HR 70 | Temp 97.5°F | Resp 18 | Ht 67.0 in | Wt 159.5 lb

## 2022-06-01 DIAGNOSIS — N189 Chronic kidney disease, unspecified: Secondary | ICD-10-CM

## 2022-06-01 DIAGNOSIS — D649 Anemia, unspecified: Secondary | ICD-10-CM | POA: Diagnosis not present

## 2022-06-01 DIAGNOSIS — Z79899 Other long term (current) drug therapy: Secondary | ICD-10-CM | POA: Insufficient documentation

## 2022-06-01 DIAGNOSIS — D638 Anemia in other chronic diseases classified elsewhere: Secondary | ICD-10-CM

## 2022-06-01 DIAGNOSIS — D631 Anemia in chronic kidney disease: Secondary | ICD-10-CM | POA: Insufficient documentation

## 2022-06-01 LAB — CMP (CANCER CENTER ONLY)
ALT: 16 U/L (ref 0–44)
AST: 23 U/L (ref 15–41)
Albumin: 3.2 g/dL — ABNORMAL LOW (ref 3.5–5.0)
Alkaline Phosphatase: 78 U/L (ref 38–126)
Anion gap: 8 (ref 5–15)
BUN: 26 mg/dL — ABNORMAL HIGH (ref 8–23)
CO2: 22 mmol/L (ref 22–32)
Calcium: 8.3 mg/dL — ABNORMAL LOW (ref 8.9–10.3)
Chloride: 109 mmol/L (ref 98–111)
Creatinine: 0.99 mg/dL (ref 0.61–1.24)
GFR, Estimated: >60 mL/min (ref >60)
Glucose, Bld: 121 mg/dL — ABNORMAL HIGH (ref 70–99)
Potassium: 3.9 mmol/L (ref 3.5–5.1)
Sodium: 139 mmol/L (ref 135–145)
Total Bilirubin: 0.5 mg/dL (ref 0.3–1.2)
Total Protein: 7.2 g/dL (ref 6.5–8.1)

## 2022-06-01 LAB — IRON AND TIBC
Iron: 53 ug/dL (ref 45–182)
Saturation Ratios: 21 % (ref 17.9–39.5)
TIBC: 252 ug/dL (ref 250–450)
UIBC: 199 ug/dL

## 2022-06-01 LAB — CBC AND DIFFERENTIAL
HCT: 31 — AB (ref 41–53)
Hemoglobin: 10.6 — AB (ref 13.5–17.5)
Neutrophils Absolute: 5.25
Platelets: 292 10*3/uL (ref 150–400)
WBC: 7.4

## 2022-06-01 LAB — TSH: TSH: 4.588 u[IU]/mL — ABNORMAL HIGH (ref 0.350–4.500)

## 2022-06-01 LAB — CBC: RBC: 3.44 — AB (ref 3.87–5.11)

## 2022-06-01 LAB — FERRITIN: Ferritin: 39 ng/mL (ref 24–336)

## 2022-06-02 ENCOUNTER — Encounter: Payer: Self-pay | Admitting: Oncology

## 2022-06-25 ENCOUNTER — Other Ambulatory Visit: Payer: Self-pay | Admitting: Internal Medicine

## 2022-07-27 ENCOUNTER — Ambulatory Visit: Payer: Medicare Other | Admitting: Internal Medicine

## 2022-08-03 ENCOUNTER — Ambulatory Visit: Payer: Medicare Other | Admitting: Internal Medicine

## 2022-08-03 ENCOUNTER — Encounter: Payer: Self-pay | Admitting: Internal Medicine

## 2022-08-03 VITALS — BP 124/80 | HR 83 | Temp 98.1°F | Resp 18 | Ht 67.0 in | Wt 157.4 lb

## 2022-08-03 DIAGNOSIS — D631 Anemia in chronic kidney disease: Secondary | ICD-10-CM

## 2022-08-03 DIAGNOSIS — J4520 Mild intermittent asthma, uncomplicated: Secondary | ICD-10-CM | POA: Diagnosis not present

## 2022-08-03 DIAGNOSIS — I10 Essential (primary) hypertension: Secondary | ICD-10-CM | POA: Diagnosis not present

## 2022-08-03 DIAGNOSIS — N189 Chronic kidney disease, unspecified: Secondary | ICD-10-CM | POA: Diagnosis not present

## 2022-08-03 DIAGNOSIS — D638 Anemia in other chronic diseases classified elsewhere: Secondary | ICD-10-CM

## 2022-08-03 NOTE — Assessment & Plan Note (Signed)
 stable °

## 2022-08-03 NOTE — Assessment & Plan Note (Signed)
I will change symbicort to Terelegy to see if that helps more. I have given sample of 100/62.5

## 2022-08-03 NOTE — Assessment & Plan Note (Signed)
Controlled.  

## 2022-08-03 NOTE — Progress Notes (Signed)
   Office Visit  Subjective   Patient ID: Jack Turner   DOB: Feb 05, 1931   Age: 89 y.o.   MRN: [id number]   Chief Complaint Chief Complaint  Patient presents with   Hypertension    6 Month follow up     History of Present Illness 89 years old male is here for follow up. He is c/o increase mucus and has to cough up to clear his secretion. He also has chronic anemia and his Hb is above 10.1.    He also has hypertension and his blood pressure is controlled.   He has asthma and is using symbicort. He has increase cough.    He has seasonal allergies and is not using anything. He is asking for something.   Past Medical History Past Medical History:  Diagnosis Date   Anemia of chronic disorder    Arthritis, degenerative    Ataxia    GERD (gastroesophageal reflux disease)    History of prostate cancer    Hypercholesterolemia    Hypertension    Overweight      Allergies Allergies  Allergen Reactions   Hydrochlorothiazide Other (See Comments)    Caused renal insufficiency     Review of Systems Review of Systems  Constitutional: Negative.   HENT: Negative.    Respiratory: Negative.    Cardiovascular: Negative.   Gastrointestinal: Negative.   Neurological: Negative.        Objective:    Vitals BP 124/80 (BP Location: Left Arm, Patient Position: Sitting, Cuff Size: Normal)   Pulse 83   Temp 98.1 F (36.7 C)   Resp 18   Ht 5\' 7"  (1.702 m)   Wt 157 lb 6 oz (71.4 kg)   SpO2 96%   BMI 24.65 kg/m    Physical Examination Physical Exam Constitutional:      Appearance: Normal appearance.  HENT:     Head: Normocephalic and atraumatic.  Cardiovascular:     Rate and Rhythm: Normal rate and regular rhythm.     Heart sounds: Normal heart sounds.  Pulmonary:     Effort: Pulmonary effort is normal.     Breath sounds: Normal breath sounds.  Abdominal:     General: Bowel sounds are normal.     Palpations: Abdomen is soft.  Neurological:     General: No focal  deficit present.     Mental Status: He is alert and oriented to person, place, and time.        Assessment & Plan:   Hypertension Controlled  Asthma I will change symbicort to Terelegy to see if that helps more. I have given sample of 100/62.5  Anemia in chronic kidney disease stable    Return in about 3 months (around 11/03/2022).   Jack Northern, MD

## 2022-08-10 DIAGNOSIS — H04123 Dry eye syndrome of bilateral lacrimal glands: Secondary | ICD-10-CM | POA: Diagnosis not present

## 2022-08-10 DIAGNOSIS — H18413 Arcus senilis, bilateral: Secondary | ICD-10-CM | POA: Diagnosis not present

## 2022-08-10 DIAGNOSIS — H401133 Primary open-angle glaucoma, bilateral, severe stage: Secondary | ICD-10-CM | POA: Diagnosis not present

## 2022-08-10 DIAGNOSIS — H18593 Other hereditary corneal dystrophies, bilateral: Secondary | ICD-10-CM | POA: Diagnosis not present

## 2022-08-31 ENCOUNTER — Other Ambulatory Visit: Payer: Self-pay | Admitting: Internal Medicine

## 2022-08-31 MED ORDER — TRELEGY ELLIPTA 100-62.5-25 MCG/ACT IN AEPB: 1.0000 | INHALATION_SPRAY | Freq: Every day | RESPIRATORY_TRACT | 4 refills | Status: DC

## 2022-09-10 DIAGNOSIS — H9193 Unspecified hearing loss, bilateral: Secondary | ICD-10-CM | POA: Diagnosis not present

## 2022-09-10 DIAGNOSIS — H6123 Impacted cerumen, bilateral: Secondary | ICD-10-CM | POA: Diagnosis not present

## 2022-10-15 ENCOUNTER — Other Ambulatory Visit: Payer: Self-pay | Admitting: Internal Medicine

## 2022-10-26 DIAGNOSIS — C61 Malignant neoplasm of prostate: Secondary | ICD-10-CM | POA: Diagnosis not present

## 2022-10-26 DIAGNOSIS — R140 Abdominal distension (gaseous): Secondary | ICD-10-CM | POA: Diagnosis not present

## 2022-10-26 DIAGNOSIS — N3289 Other specified disorders of bladder: Secondary | ICD-10-CM | POA: Diagnosis not present

## 2022-10-27 ENCOUNTER — Other Ambulatory Visit: Payer: Self-pay | Admitting: Internal Medicine

## 2022-10-29 ENCOUNTER — Other Ambulatory Visit: Payer: Self-pay | Admitting: Internal Medicine

## 2022-11-02 ENCOUNTER — Ambulatory Visit: Payer: Medicare Other | Admitting: Internal Medicine

## 2022-11-02 ENCOUNTER — Encounter: Payer: Self-pay | Admitting: Internal Medicine

## 2022-11-02 VITALS — BP 130/78 | HR 68 | Temp 97.8°F | Resp 18 | Ht 67.0 in | Wt 161.0 lb

## 2022-11-02 DIAGNOSIS — N182 Chronic kidney disease, stage 2 (mild): Secondary | ICD-10-CM

## 2022-11-02 DIAGNOSIS — D638 Anemia in other chronic diseases classified elsewhere: Secondary | ICD-10-CM

## 2022-11-02 DIAGNOSIS — I10 Essential (primary) hypertension: Secondary | ICD-10-CM

## 2022-11-02 DIAGNOSIS — Z23 Encounter for immunization: Secondary | ICD-10-CM

## 2022-11-02 DIAGNOSIS — D631 Anemia in chronic kidney disease: Secondary | ICD-10-CM | POA: Diagnosis not present

## 2022-11-02 DIAGNOSIS — J4520 Mild intermittent asthma, uncomplicated: Secondary | ICD-10-CM | POA: Diagnosis not present

## 2022-11-02 HISTORY — DX: Encounter for immunization: Z23

## 2022-11-02 NOTE — Assessment & Plan Note (Signed)
controlled

## 2022-11-02 NOTE — Progress Notes (Signed)
Office Visit  Subjective   Patient ID: Jack Turner   DOB: [birth year]   Age: 89 y.o.   MRN: [id number]   Chief Complaint Chief Complaint  Patient presents with   Follow-up    3 month follow up     History of Present Illness 89 years old male is here for follow up. He says that terelegy seems helping in his mucus and breathing. He also takes Singulair 10 mg daily.   He also has chronic anemia and his Hb is above 10.1. He will follow with Dr, Melvyn Neth.   He also has hypertension and his blood pressure is controlled.    He has asthma and is using symbicort. He has increase cough.    He has seasonal allergies and is not using anything. He is asking for something.   Past Medical History Past Medical History:  Diagnosis Date   Anemia of chronic disorder    Arthritis, degenerative    Ataxia    GERD (gastroesophageal reflux disease)    History of prostate cancer    Hypercholesterolemia    Hypertension    Overweight      Allergies Allergies  Allergen Reactions   Hydrochlorothiazide Other (See Comments)    Caused renal insufficiency     Review of Systems Review of Systems  Constitutional: Negative.   HENT: Negative.    Respiratory: Negative.    Cardiovascular: Negative.   Gastrointestinal: Negative.   Neurological: Negative.        Objective:    Vitals BP 130/78 (BP Location: Left Arm, Patient Position: Sitting, Cuff Size: Normal)   Pulse 68   Temp 97.8 F (36.6 C)   Resp 18   Ht 5\' 7"  (1.702 m)   Wt 161 lb (73 kg)   SpO2 98%   BMI 25.22 kg/m    Physical Examination Physical Exam Constitutional:      Appearance: Normal appearance.  HENT:     Head: Normocephalic and atraumatic.  Cardiovascular:     Rate and Rhythm: Normal rate and regular rhythm.     Heart sounds: Normal heart sounds.  Pulmonary:     Effort: Pulmonary effort is normal.     Breath sounds: Normal breath sounds.  Abdominal:     General: Bowel sounds are normal.     Palpations:  Abdomen is soft.  Neurological:     General: No focal deficit present.     Mental Status: He is alert and oriented to person, place, and time.        Assessment & Plan:   Hypertension controlled  Asthma Better with terelegy and Singulair so will continue to monitor..   Anemia in chronic kidney disease He has appointment with [name] next month.   Need for prophylactic vaccination and inoculation against influenza Will give him flu shot today.     Return in about 3 months (around 02/02/2023).   Eloisa Northern, MD

## 2022-11-02 NOTE — Assessment & Plan Note (Signed)
He has appointment with [name] next month.

## 2022-11-02 NOTE — Assessment & Plan Note (Signed)
Will give him flu shot today.

## 2022-11-02 NOTE — Assessment & Plan Note (Signed)
Better with terelegy and Singulair so will continue to monitor.Jack Turner

## 2022-12-02 NOTE — Progress Notes (Deleted)
[facility]  [address] [place],  [place]  [address] [phone]  Clinic Day:  06/01/2022  Referring physician: Eloisa Northern, MD   HISTORY OF PRESENT ILLNESS:  The patient is a 89 y.o. male with anemia secondary to chronic renal insufficiency.  Over these past months, his hemoglobin has consistently been above 10 to where red cell shot therapy has not been necessary.  He comes in today for routine follow-up.  Since his last visit, the patient has been doing well.  He denies having increased fatigue or any overt forms of blood loss which concern him for progressive anemia.  PHYSICAL EXAM:  There were no vitals taken for this visit. Wt Readings from Last 3 Encounters:  11/02/22 161 lb (73 kg)  08/03/22 157 lb 6 oz (71.4 kg)  06/01/22 159 lb 8 oz (72.3 kg)   There is no height or weight on file to calculate BMI. Performance status (ECOG): 1 - Symptomatic but completely ambulatory Physical Exam Constitutional:      Appearance: Normal appearance. He is not ill-appearing.  HENT:     Mouth/Throat:     Mouth: Mucous membranes are moist.     Pharynx: Oropharynx is clear. No oropharyngeal exudate or posterior oropharyngeal erythema.  Cardiovascular:     Rate and Rhythm: Normal rate and regular rhythm.     Heart sounds: No murmur heard.    No friction rub. No gallop.  Pulmonary:     Effort: Pulmonary effort is normal. No respiratory distress.     Breath sounds: Normal breath sounds. No wheezing, rhonchi or rales.  Abdominal:     General: Bowel sounds are normal. There is no distension.     Palpations: Abdomen is soft. There is no mass.     Tenderness: There is no abdominal tenderness.  Musculoskeletal:        General: No swelling.     Right lower leg: No edema.     Left lower leg: No edema.  Lymphadenopathy:     Cervical: No cervical adenopathy.     Upper Body:     Right upper body: No supraclavicular or axillary adenopathy.     Left upper  body: No supraclavicular or axillary adenopathy.     Lower Body: No right inguinal adenopathy. No left inguinal adenopathy.  Skin:    General: Skin is warm.     Coloration: Skin is not jaundiced.     Findings: No lesion or rash.  Neurological:     General: No focal deficit present.     Mental Status: He is alert and oriented to person, place, and time. Mental status is at baseline.  Psychiatric:        Mood and Affect: Mood normal.        Behavior: Behavior normal.        Thought Content: Thought content normal.    LABS:    Latest Reference Range & Units 06/01/22 00:00  WBC  7.4 (E)  RBC 3.87 - 5.11  3.44 ! (E)  Hemoglobin 13.5 - 17.5  10.6 ! (E)  HCT 41 - 53  31 ! (E)  Platelets 150 - 400 K/uL 292 (E)  !: Data is abnormal (E): External lab result     Latest Ref Rng & Units 06/01/2022    1:58 PM 01/30/2022   12:00 AM 09/04/2021   12:00 AM  CMP  Glucose 70 - 99 mg/dL 244     BUN 8 - 23 mg/dL 26  26     24      Creatinine 0.61 - 1.24 mg/dL 4.09  1.1     1.7      Sodium 135 - 145 mmol/L 139  139     142      Potassium 3.5 - 5.1 mmol/L 3.9  3.9     3.7      Chloride 98 - 111 mmol/L 109  108     109      CO2 22 - 32 mmol/L 22  23     23       Calcium 8.9 - 10.3 mg/dL 8.3  8.4     8.2      Total Protein 6.5 - 8.1 g/dL 7.2     Total Bilirubin 0.3 - 1.2 mg/dL 0.5     Alkaline Phos 38 - 126 U/L 78  80     93      AST 15 - 41 U/L 23  30     27       ALT 0 - 44 U/L 16  17     23          This result is from an external source.   ASSESSMENT & PLAN:  An 89 y.o. male with anemia secondary to chronic renal insufficiency.  I am pleased as this gentleman's hemoglobin remains well above 10.  Based upon this, red cell shot therapy remains unnecessary at this time.  As his hemoglobin has held stable and he is clinically doing well, I will see him back in 6 months for repeat clinical assessment. The patient understands all the plans discussed today and is in agreement with them.  Dezarai Prew Kirby Funk, MD

## 2022-12-03 ENCOUNTER — Other Ambulatory Visit: Payer: Self-pay | Admitting: Oncology

## 2022-12-03 ENCOUNTER — Inpatient Hospital Stay: Payer: Medicare Other

## 2022-12-03 ENCOUNTER — Inpatient Hospital Stay: Payer: Medicare Other | Attending: Oncology | Admitting: Oncology

## 2022-12-03 VITALS — BP 158/55 | HR 82 | Temp 98.4°F | Resp 18 | Ht 67.0 in | Wt 164.5 lb

## 2022-12-03 DIAGNOSIS — N189 Chronic kidney disease, unspecified: Secondary | ICD-10-CM | POA: Insufficient documentation

## 2022-12-03 DIAGNOSIS — Z79899 Other long term (current) drug therapy: Secondary | ICD-10-CM | POA: Insufficient documentation

## 2022-12-03 DIAGNOSIS — D631 Anemia in chronic kidney disease: Secondary | ICD-10-CM

## 2022-12-03 LAB — CBC WITH DIFFERENTIAL (CANCER CENTER ONLY)
Abs Immature Granulocytes: 0.05 10*3/uL (ref 0.00–0.07)
Basophils Absolute: 0.1 10*3/uL (ref 0.0–0.1)
Basophils Relative: 1 %
Eosinophils Absolute: 0.2 10*3/uL (ref 0.0–0.5)
Eosinophils Relative: 2 %
HCT: 33.2 % — ABNORMAL LOW (ref 39.0–52.0)
Hemoglobin: 11.2 g/dL — ABNORMAL LOW (ref 13.0–17.0)
Immature Granulocytes: 1 %
Lymphocytes Relative: 16 %
Lymphs Abs: 1.6 10*3/uL (ref 0.7–4.0)
MCH: 30.5 pg (ref 26.0–34.0)
MCHC: 33.7 g/dL (ref 30.0–36.0)
MCV: 90.5 fL (ref 80.0–100.0)
Monocytes Absolute: 0.7 10*3/uL (ref 0.1–1.0)
Monocytes Relative: 7 %
Neutro Abs: 7.5 10*3/uL (ref 1.7–7.7)
Neutrophils Relative %: 73 %
Platelet Count: 316 10*3/uL (ref 150–400)
RBC: 3.67 MIL/uL — ABNORMAL LOW (ref 4.22–5.81)
RDW: 13.4 % (ref 11.5–15.5)
WBC Count: 10.2 10*3/uL (ref 4.0–10.5)
nRBC: 0 % (ref 0.0–0.2)
nRBC: 0 /100{WBCs}

## 2022-12-03 LAB — CMP (CANCER CENTER ONLY)
ALT: 10 U/L (ref 0–44)
AST: 24 U/L (ref 15–41)
Albumin: 3.9 g/dL (ref 3.5–5.0)
Alkaline Phosphatase: 98 U/L (ref 38–126)
Anion gap: 11 (ref 5–15)
BUN: 31 mg/dL — ABNORMAL HIGH (ref 8–23)
CO2: 23 mmol/L (ref 22–32)
Calcium: 9.5 mg/dL (ref 8.9–10.3)
Chloride: 105 mmol/L (ref 98–111)
Creatinine: 1.13 mg/dL (ref 0.61–1.24)
GFR, Estimated: >60 mL/min (ref >60)
Glucose, Bld: 94 mg/dL (ref 70–99)
Potassium: 4.5 mmol/L (ref 3.5–5.1)
Sodium: 139 mmol/L (ref 135–145)
Total Bilirubin: 0.4 mg/dL (ref <1.2)
Total Protein: 8.2 g/dL — ABNORMAL HIGH (ref 6.5–8.1)

## 2022-12-03 LAB — IRON AND TIBC
Iron: 65 ug/dL (ref 45–182)
Saturation Ratios: 22 % (ref 17.9–39.5)
TIBC: 293 ug/dL (ref 250–450)
UIBC: 228 ug/dL

## 2022-12-03 LAB — FERRITIN: Ferritin: 37 ng/mL (ref 24–336)

## 2022-12-04 ENCOUNTER — Encounter: Payer: Self-pay | Admitting: Oncology

## 2022-12-04 NOTE — Progress Notes (Signed)
[facility]  [address] [place],  [place]  [address] [phone]  Clinic Day:  12/03/2022  Referring physician: Eloisa Northern, MD   HISTORY OF PRESENT ILLNESS:  The patient is a 89 y.o. male with anemia secondary to chronic renal insufficiency.  Over these past months, his hemoglobin has consistently been above 10 to where red cell shot therapy has not been necessary.  He comes in today for routine follow-up.  Since his last visit, the patient has been doing well.  He denies having increased fatigue or any overt forms of blood loss which concern him for progressive anemia.   PHYSICAL EXAM:  Blood pressure (!) 158/55, pulse 82, temperature 98.4 F (36.9 C), resp. rate 18, height 5\' 7"  (1.702 m), weight 164 lb 8 oz (74.6 kg), SpO2 96%. Wt Readings from Last 3 Encounters:  12/03/22 164 lb 8 oz (74.6 kg)  11/02/22 161 lb (73 kg)  08/03/22 157 lb 6 oz (71.4 kg)   Body mass index is 25.76 kg/m. Performance status (ECOG): 1 - Symptomatic but completely ambulatory Physical Exam Constitutional:      Appearance: Normal appearance. He is not ill-appearing.  HENT:     Mouth/Throat:     Mouth: Mucous membranes are moist.     Pharynx: Oropharynx is clear. No oropharyngeal exudate or posterior oropharyngeal erythema.  Cardiovascular:     Rate and Rhythm: Normal rate and regular rhythm.     Heart sounds: No murmur heard.    No friction rub. No gallop.  Pulmonary:     Effort: Pulmonary effort is normal. No respiratory distress.     Breath sounds: Normal breath sounds. No wheezing, rhonchi or rales.  Abdominal:     General: Bowel sounds are normal. There is no distension.     Palpations: Abdomen is soft. There is no mass.     Tenderness: There is no abdominal tenderness.  Musculoskeletal:        General: No swelling.     Right lower leg: No edema.     Left lower leg: No edema.  Lymphadenopathy:     Cervical: No cervical adenopathy.     Upper Body:      Right upper body: No supraclavicular or axillary adenopathy.     Left upper body: No supraclavicular or axillary adenopathy.     Lower Body: No right inguinal adenopathy. No left inguinal adenopathy.  Skin:    General: Skin is warm.     Coloration: Skin is not jaundiced.     Findings: No lesion or rash.  Neurological:     General: No focal deficit present.     Mental Status: He is alert and oriented to person, place, and time. Mental status is at baseline.  Psychiatric:        Mood and Affect: Mood normal.        Behavior: Behavior normal.        Thought Content: Thought content normal.     LABS:      Latest Ref Rng & Units 12/03/2022    1:12 PM 06/01/2022   12:00 AM 03/31/2022    1:08 PM  CBC  WBC 4.0 - 10.5 K/uL 10.2  7.4     8.9   Hemoglobin 13.0 - 17.0 g/dL 56.2  13.0     86.5   Hematocrit 39.0 - 52.0 % 33.2  31     33.1   Platelets 150 - 400 K/uL 316  292  299      This result is from an external source.      Latest Ref Rng & Units 12/03/2022    1:12 PM 06/01/2022    1:58 PM 01/30/2022   12:00 AM  CMP  Glucose 70 - 99 mg/dL 94  027    BUN 8 - 23 mg/dL 31  26  26       Creatinine 0.61 - 1.24 mg/dL 2.53  6.64  1.1      Sodium 135 - 145 mmol/L 139  139  139      Potassium 3.5 - 5.1 mmol/L 4.5  3.9  3.9      Chloride 98 - 111 mmol/L 105  109  108      CO2 22 - 32 mmol/L 23  22  23       Calcium 8.9 - 10.3 mg/dL 9.5  8.3  8.4      Total Protein 6.5 - 8.1 g/dL 8.2  7.2    Total Bilirubin <1.2 mg/dL 0.4  0.5    Alkaline Phos 38 - 126 U/L 98  78  80      AST 15 - 41 U/L 24  23  30       ALT 0 - 44 U/L 10  16  17          This result is from an external source.    Latest Reference Range & Units 12/03/22 13:12  Iron 45 - 182 ug/dL 65  UIBC ug/dL 403  TIBC 474 - 259 ug/dL 563  Saturation Ratios 17.9 - 39.5 % 22  Ferritin 24 - 336 ng/mL 37    ASSESSMENT & PLAN:  Assessment/Plan:  A 88 y.o. male with anemia secondary to chronic renal insufficiency.  I am pleased as  this gentleman's hemoglobin remains well above 10.  Based upon this, red cell shot therapy remains unnecessary at this time.  As his hemoglobin has held stable and he is clinically doing well, I will see him back in 6 months for repeat clinical assessment. The patient understands all the plans discussed today and is in agreement with them.     Ralf Konopka Kirby Funk, MD

## 2023-01-23 ENCOUNTER — Other Ambulatory Visit: Payer: Self-pay | Admitting: Internal Medicine

## 2023-01-24 ENCOUNTER — Other Ambulatory Visit: Payer: Self-pay | Admitting: Internal Medicine

## 2023-01-26 ENCOUNTER — Other Ambulatory Visit: Payer: Self-pay | Admitting: Internal Medicine

## 2023-02-08 ENCOUNTER — Ambulatory Visit: Payer: Medicare Other | Admitting: Internal Medicine

## 2023-02-08 ENCOUNTER — Encounter: Payer: Self-pay | Admitting: Internal Medicine

## 2023-02-08 VITALS — BP 162/78 | HR 76 | Temp 97.7°F | Resp 18 | Ht 67.0 in | Wt 167.0 lb

## 2023-02-08 DIAGNOSIS — M4800 Spinal stenosis, site unspecified: Secondary | ICD-10-CM | POA: Insufficient documentation

## 2023-02-08 DIAGNOSIS — H9190 Unspecified hearing loss, unspecified ear: Secondary | ICD-10-CM | POA: Insufficient documentation

## 2023-02-08 DIAGNOSIS — Z6826 Body mass index (BMI) 26.0-26.9, adult: Secondary | ICD-10-CM

## 2023-02-08 DIAGNOSIS — N182 Chronic kidney disease, stage 2 (mild): Secondary | ICD-10-CM

## 2023-02-08 DIAGNOSIS — J4520 Mild intermittent asthma, uncomplicated: Secondary | ICD-10-CM | POA: Diagnosis not present

## 2023-02-08 DIAGNOSIS — D631 Anemia in chronic kidney disease: Secondary | ICD-10-CM | POA: Diagnosis not present

## 2023-02-08 DIAGNOSIS — Z0000 Encounter for general adult medical examination without abnormal findings: Secondary | ICD-10-CM

## 2023-02-08 DIAGNOSIS — K219 Gastro-esophageal reflux disease without esophagitis: Secondary | ICD-10-CM

## 2023-02-08 DIAGNOSIS — Z8546 Personal history of malignant neoplasm of prostate: Secondary | ICD-10-CM

## 2023-02-08 DIAGNOSIS — Z131 Encounter for screening for diabetes mellitus: Secondary | ICD-10-CM

## 2023-02-08 DIAGNOSIS — I10 Essential (primary) hypertension: Secondary | ICD-10-CM

## 2023-02-08 HISTORY — DX: Encounter for screening for diabetes mellitus: Z13.1

## 2023-02-08 HISTORY — DX: Spinal stenosis, site unspecified: M48.00

## 2023-02-08 HISTORY — DX: Unspecified hearing loss, unspecified ear: H91.90

## 2023-02-08 MED ORDER — VALSARTAN 160 MG PO TABS: 160.0000 mg | ORAL_TABLET | Freq: Every day | ORAL | 6 refills | Status: DC

## 2023-02-08 NOTE — Assessment & Plan Note (Signed)
He take trelegy and singulair.

## 2023-02-08 NOTE — Assessment & Plan Note (Signed)
stable °

## 2023-02-08 NOTE — Assessment & Plan Note (Signed)
He wear hearing aids.

## 2023-02-08 NOTE — Assessment & Plan Note (Signed)
His blood pressure is high here but he check at home and that reading are better.

## 2023-02-08 NOTE — Assessment & Plan Note (Signed)
He take prilosec as needed basis

## 2023-02-08 NOTE — Assessment & Plan Note (Signed)
He follows with urologist every 6 months.

## 2023-02-08 NOTE — Assessment & Plan Note (Signed)
He is doing better and occasionally take tylenol arthritis.

## 2023-02-08 NOTE — Progress Notes (Signed)
Office Visit  Subjective   Patient ID: Jack Turner   DOB: 03-23-1931   Age: 89 y.o.   MRN: [id number]   Chief Complaint Chief Complaint  Patient presents with   Annual Exam    Medicare annual exam      History of Present Illness 89 years old male is here for annual wellness. He live alone, he does drive locally. He is independent in all ADL. He can cook what he need for his meal. He denies any fall within last 1 year. He is careful enough not to fall. He use cane on uneven surface. He score 30/30 on MMSE.   He get flu shot every, he has already flu shot this year. He also has RSV 11/30/2022. He has two shingle vaccine and Pneumonia vaccine. He also has COVID vaccines. His tetanus booster was within last 10 years.   He has hypertension and chronic kidney disease. He takes amlodipine 5 mg daily, valsartan 80 mg daily and metoprolol 25 mg daily. He check his blood pressure at home and it is better but it is high in office. He follow [name] for anemia and his hemoglobin 11.2 on 12/03/22.   He has asthma and his asthma is much better, he use telergy and take Singulair 10 mg daily.   He also has glaucoma and follow with eye doctor.   He has prostate cancer and he was given seed implant and he is doing much better and follows with Urologist every 6 month.   He is hard of hearing. He wear hearing aids.   He also has spinal stenosis and he can not stand longer because of pain. He takes tylenol arthritis.   He has bone density scan within last 5 years.   He also has dyslipidemia and he use to take cholesterol medicine but that was stopped by previous PC    Past Medical History Past Medical History:  Diagnosis Date   Anemia of chronic disorder    Arthritis, degenerative    Ataxia    GERD (gastroesophageal reflux disease)    History of prostate cancer    Hypercholesterolemia    Hypertension    Overweight      Allergies Allergies  Allergen Reactions    Hydrochlorothiazide Other (See Comments)    Caused renal insufficiency     Review of Systems Review of Systems  Constitutional: Negative.   HENT: Negative.    Respiratory: Negative.    Cardiovascular: Negative.   Gastrointestinal: Negative.   Neurological: Negative.        Objective:    Vitals BP (!) 162/78 (BP Location: Left Arm, Patient Position: Sitting, Cuff Size: Normal)   Pulse 76   Temp 97.7 F (36.5 C)   Resp 18   Ht 5\' 7"  (1.702 m)   Wt 167 lb (75.8 kg)   SpO2 97%   BMI 26.16 kg/m    Physical Examination Physical Exam HENT:     Head: Normocephalic and atraumatic.  Cardiovascular:     Rate and Rhythm: Normal rate and regular rhythm.     Heart sounds: Normal heart sounds.  Pulmonary:     Effort: Pulmonary effort is normal.     Breath sounds: Normal breath sounds.  Abdominal:     General: Bowel sounds are normal.     Palpations: Abdomen is soft.  Neurological:     General: No focal deficit present.     Mental Status: He is alert and oriented to person, place, and  time.        Assessment & Plan:   Hypertension His blood pressure is high here but he check at home and that reading are better.   Asthma He take trelegy and singulair.  GERD (gastroesophageal reflux disease) He take prilosec as needed basis  Anemia in chronic kidney disease stable  History of prostate cancer He follows with urologist every 6 months.  Spinal stenosis He is doing better and occasionally take tylenol arthritis.   Hard of hearing He wear hearing aids.     Return in about 3 months (around 05/09/2023).   Eloisa Northern, MD

## 2023-02-09 LAB — CMP14 + ANION GAP
ALT: 17 [IU]/L (ref 0–44)
AST: 24 [IU]/L (ref 0–40)
Albumin: 3.8 g/dL (ref 3.6–4.6)
Alkaline Phosphatase: 98 [IU]/L (ref 44–121)
Anion Gap: 13 mmol/L (ref 10.0–18.0)
BUN/Creatinine Ratio: 25 — ABNORMAL HIGH (ref 10–24)
BUN: 28 mg/dL (ref 10–36)
Bilirubin Total: 0.4 mg/dL (ref 0.0–1.2)
CO2: 21 mmol/L (ref 20–29)
Calcium: 8.8 mg/dL (ref 8.6–10.2)
Chloride: 110 mmol/L — ABNORMAL HIGH (ref 96–106)
Creatinine, Ser: 1.14 mg/dL (ref 0.76–1.27)
Globulin, Total: 3.5 g/dL (ref 1.5–4.5)
Glucose: 92 mg/dL (ref 70–99)
Potassium: 4.9 mmol/L (ref 3.5–5.2)
Sodium: 144 mmol/L (ref 134–144)
Total Protein: 7.3 g/dL (ref 6.0–8.5)
eGFR: 61 mL/min/{1.73_m2} (ref >59)

## 2023-02-09 LAB — LIPID PANEL
Chol/HDL Ratio: 4.9 {ratio} (ref 0.0–5.0)
Cholesterol, Total: 207 mg/dL — ABNORMAL HIGH (ref 100–199)
HDL: 42 mg/dL (ref >39)
LDL Chol Calc (NIH): 148 mg/dL — ABNORMAL HIGH (ref 0–99)
Triglycerides: 92 mg/dL (ref 0–149)
VLDL Cholesterol Cal: 17 mg/dL (ref 5–40)

## 2023-02-09 LAB — HEMOGLOBIN A1C
Est. average glucose Bld gHb Est-mCnc: 126 mg/dL
Hgb A1c MFr Bld: 6 % — ABNORMAL HIGH (ref 4.8–5.6)

## 2023-02-18 ENCOUNTER — Ambulatory Visit: Payer: Medicare Other

## 2023-02-18 NOTE — Progress Notes (Signed)
Blood pressure check only

## 2023-04-08 ENCOUNTER — Other Ambulatory Visit: Payer: Self-pay

## 2023-04-08 ENCOUNTER — Telehealth: Payer: Self-pay | Admitting: Cardiology

## 2023-04-08 NOTE — Telephone Encounter (Signed)
 Spoke to [employer] per DPR. She reports that the patient has had high blood pressure recently. She reports that highest blood pressure was 197 systolic. Per Dianna the patient has had no symptoms. Advised Dianna that patient needs an appointment to discuss blood pressure and medications due to him not being seen since 12/2020. Appointment made for tomorrow 04/09/2023. Advised Dianna if patient starts to have any symptoms that patient should go to the ER for evaluation. She verbalized understanding and had no further questions

## 2023-04-08 NOTE — Telephone Encounter (Signed)
 Pt c/o BP issue:  Pt c/o BP issue: STAT if pt c/o blurred vision, one-sided weakness or slurred speech  1. What are your last 5 BP readings?    2. Are you having any other symptoms (ex. Dizziness, headache, blurred vision, passed out)?  Palpitations + machine shows irregular HR  3. What is your BP issue?   Daughter states for the past few days BP has been elevated. Systolic has been around 167, and got up to about 197 highest. Daughter states it's normally around 130-140's. She says patient has had irregular HR and palpitations, but no other symptoms. Daughter says she doesn't have readings, but son keeps track of them.

## 2023-04-09 ENCOUNTER — Ambulatory Visit

## 2023-04-09 VITALS — BP 176/88 | HR 75 | Ht 68.0 in | Wt 164.8 lb

## 2023-04-09 DIAGNOSIS — R002 Palpitations: Secondary | ICD-10-CM

## 2023-04-09 DIAGNOSIS — I10 Essential (primary) hypertension: Secondary | ICD-10-CM | POA: Insufficient documentation

## 2023-04-09 HISTORY — DX: Palpitations: R00.2

## 2023-04-09 MED ORDER — CARVEDILOL 3.125 MG PO TABS: 3.1250 mg | ORAL_TABLET | Freq: Two times a day (BID) | ORAL | 3 refills | Status: DC

## 2023-04-09 NOTE — Progress Notes (Signed)
 Cardiology Consultation:    Date:  04/09/2023   ID:  Jack Turner, DOB 1931-08-14, MRN [id number]  PCP:  Eloisa Northern, MD  Cardiologist:  Marlyn Corporal Jodean Valade, MD   Referring MD: Eloisa Northern, MD   No chief complaint on file.    ASSESSMENT AND PLAN:   Mr. Jack Turner 89 year old male hypertension, mild mitral regurgitation, hyperlipidemia, palpitations, chronic kidney disease stage II, anemia, asthma, prostate cancer, chronic back pain, history of lumbar spinal stenosis here for follow-up regarding blood pressures which have been uncontrolled at home and blood pressure monitor evaluating for irregular heartbeats Problem List Items Addressed This Visit     Hypertension - Primary   Blood pressures suboptimal over the past week at home with systolic ranging between 150s to 170s. Otherwise blood pressure readings have been pretty well-controlled below 150s for the most part. No significant symptoms but does have a sense of extra beat or skipped beat while changing blood pressure as well as heart monitor.  Continue amlodipine 5 mg once daily Continue valsartan 160 mg once daily. Switch metoprolol succinate to carvedilol 3.125 mg twice daily.  Advised to continue monitoring blood pressures. Target blood pressure below 150/90 mmHg considering his advanced age.      Relevant Orders   EKG 12-Lead (Completed)   Palpitations   Occasional sense of extra beat or skipped beat, correlating with irregular heartbeat notification on blood pressure monitoring device at home.  Will assess with a heart monitor for 7 days.       Further follow-up in the clinic based on test results.   History of Present Illness:    Jack Turner is a 89 y.o. male who is being seen today for follow-up visit. Last visit at our office was with Dr. Dulce Sellar December 2022. PCP is Eloisa Northern, MD.  Has history of hypertension, mild mitral regurgitation, hyperlipidemia, palpitations, chronic kidney disease  stage 2, anemia, asthma, prostate cancer [follows up with urologist], chronic back pain with the history of lumbar spinal stenosis.  Here for the visit accompanied by his daughter.  Lives by himself and his daughter lives nearby. He is here in the setting of uncontrolled blood pressures at home recently for the past week associated with irregular heartbeat alert on the device.  Mentions no significant symptoms other than a sense of extra beat occasionally when checking his blood pressure.  Denies any chest pain, shortness of breath.  Denies any orthopnea or paroxysmal nocturnal dyspnea.  Has good functional status walking without any aid in the house.  Outdoors when he goes around he uses a cane.  Denies any falls, syncopal or near syncopal episodes. Mentions good compliance with his medications amlodipine, metoprolol, valsartan for blood pressure.  Uses Prilosec as needed for GERD or heartburn symptoms.  EKG in the clinic today shows sinus rhythm heart rate 75/min, PR interval normal 166 ms, normal nonspecific intraventricular conduction delay QRS duration 122 ms.  Prior echocardiogram for comparison is from September 2022 with normal biventricular function LVEF 60 to 65%, grade 1 diastolic dysfunction, mild mitral regurgitation, stage  Blood work from 02-08-2023 with BUN 21, creatinine 1.14, EGFR 61 Sodium 144, potassium 4.9 Normal transaminases and alkaline phosphatase. Hemoglobin A1c 6 Lipid panel total worse from 207, HDL 42, LDL 148, triglycerides 92.    Past Medical History:  Diagnosis Date   Abnormal MRI, thoracic spine 08/26/2021   Anemia in chronic kidney disease 09/05/2021   Anemia of chronic disorder    Arthritis, degenerative  Asthma 01/26/2022   Ataxia    GERD (gastroesophageal reflux disease)    Hard of hearing 02/08/2023   History of prostate cancer    Hypercholesterolemia    Hypertension    Overweight    Screening for diabetes mellitus (DM) 02/08/2023    Seasonal allergies 01/26/2022   Spinal stenosis 02/08/2023    Past Surgical History:  Procedure Laterality Date   CATARACT EXTRACTION Right    INGUINAL HERNIA REPAIR Right 08/03/2006   Performed by Dr. Meryl Crutch at [facility]   PROSTATE SURGERY     TOTAL HIP ARTHROPLASTY Bilateral 02/23/1996    Current Medications: Current Meds  Medication Sig   ALPHAGAN P 0.1 % SOLN Place 1 drop into the left eye 2 (two) times daily.   amLODipine (NORVASC) 5 MG tablet TAKE 1 TABLET BY MOUTH EVERY DAY   b complex vitamins capsule Take 1 capsule by mouth every other day.   calcium-vitamin D (OSCAL WITH D) 500-5 MG-MCG tablet Take 1 tablet by mouth daily.   dorzolamide (TRUSOPT) 2 % ophthalmic solution Place 1 drop into the left eye 2 (two) times daily.   fluticasone (FLONASE) 50 MCG/ACT nasal spray SPRAY 2 SPRAYS INTO EACH NOSTRIL EVERY DAY   latanoprost (XALATAN) 0.005 % ophthalmic solution Place 1 drop into both eyes 2 (two) times daily.   Misc Natural Products (URINOZINC PO) Take 1 tablet by mouth daily. Strength unknown   montelukast (SINGULAIR) 10 MG tablet TAKE 1 TABLET BY MOUTH EVERY DAY   Multiple Vitamin (MULTIVITAMIN) tablet Take 1 tablet by mouth daily.   Multiple Vitamins-Minerals (EYE HEALTH PO) Take 1 tablet by mouth daily.   SYSTANE BALANCE 0.6 % SOLN Place 1 drop into both eyes as needed.   TRELEGY ELLIPTA 100-62.5-25 MCG/ACT AEPB TAKE 1 PUFF BY MOUTH EVERY DAY   valsartan (DIOVAN) 160 MG tablet Take 1 tablet (160 mg total) by mouth daily.   [DISCONTINUED] metoprolol succinate (TOPROL-XL) 25 MG 24 hr tablet TAKE 1 TABLET BY MOUTH EVERY DAY     Allergies:   Hydrochlorothiazide   Social History   Socioeconomic History   Marital status: Married    Spouse name: Not on file   Number of children: Not on file   Years of education: Not on file   Highest education level: Not on file  Occupational History   Not on file  Tobacco Use   Smoking status: Former    Current packs/day: 0.00     Types: Cigarettes    Quit date: 44    Years since quitting: 33.2   Smokeless tobacco: Never  Vaping Use   Vaping status: Never Used  Substance and Sexual Activity   Alcohol use: Yes    Comment: occasional/ 1 every 2-3 months   Drug use: Never   Sexual activity: Not on file  Other Topics Concern   Not on file  Social History Narrative   Not on file   Social Drivers of Health   Financial Resource Strain: Not on file  Food Insecurity: Not on file  Transportation Needs: Not on file  Physical Activity: Not on file  Stress: Not on file  Social Connections: Not on file     Family History: The patient's family history includes Other in his brother. ROS:   Please see the history of present illness.    All 14 point review of systems negative except as described per history of present illness.  EKGs/Labs/Other Studies Reviewed:    The following studies were reviewed today:  EKG:  EKG Interpretation Date/Time:  Friday April 09 2023 14:41:20 EDT Ventricular Rate:  75 PR Interval:  166 QRS Duration:  122 QT Interval:  390 QTC Calculation: 435 R Axis:   -9  Text Interpretation: Normal sinus rhythm Non-specific intra-ventricular conduction delay Nonspecific ST abnormality Abnormal ECG When compared with ECG of 05-May-2007 12:17, QRS duration has increased Inverted T waves have replaced nonspecific T wave abnormality in Inferior leads Confirmed by Huntley Dec reddy [phone]) on 04/09/2023 2:55:16 PM    Recent Labs: 06/01/2022: TSH 4.588 12/03/2022: Hemoglobin 11.2; Platelet Count 316 02/08/2023: ALT 17; BUN 28; Creatinine, Ser 1.14; Potassium 4.9; Sodium 144  Recent Lipid Panel    Component Value Date/Time   CHOL 207 (H) 02/08/2023 1437   TRIG 92 02/08/2023 1437   HDL 42 02/08/2023 1437   CHOLHDL 4.9 02/08/2023 1437   LDLCALC 148 (H) 02/08/2023 1437    Physical Exam:    VS:  BP (!) 176/88   Pulse 75   Ht 5\' 8"  (1.727 m)   Wt 164 lb 12.8 oz (74.8 kg)   SpO2 96%    BMI 25.06 kg/m     Wt Readings from Last 3 Encounters:  04/09/23 164 lb 12.8 oz (74.8 kg)  02/08/23 167 lb (75.8 kg)  12/03/22 164 lb 8 oz (74.6 kg)     GENERAL:  Well nourished, well developed in no acute distress NECK: No JVD; No carotid bruits CARDIAC: RRR, S1 and S2 present, no murmurs, no rubs, no gallops CHEST:  Clear to auscultation without rales, wheezing or rhonchi  Extremities: No pitting pedal edema. Pulses bilaterally symmetric with radial 2+ and dorsalis pedis 2+ NEUROLOGIC:  Alert and oriented x 3  Medication Adjustments/Labs and Tests Ordered: Current medicines are reviewed at length with the patient today.  Concerns regarding medicines are outlined above.  Orders Placed This Encounter  Procedures   EKG 12-Lead   No orders of the defined types were placed in this encounter.   Signed, Cecille Amsterdam, MD, MPH, [facility]. 04/09/2023 3:20 PM    [facility]

## 2023-04-09 NOTE — Patient Instructions (Addendum)
 Medication Instructions:  Your physician has recommended you make the following change in your medication:   Stop Metoprolol  Start Carvedilol 3.125 mg twice daily  *If you need a refill on your cardiac medications before your next appointment, please call your pharmacy*   Lab Work: None ordered If you have labs (blood work) drawn today and your tests are completely normal, you will receive your results only by: MyChart Message (if you have MyChart) OR A paper copy in the mail If you have any lab test that is abnormal or we need to change your treatment, we will call you to review the results.   Testing/Procedures: A zio monitor was ordered today. It will remain on for 7 days. Remove 04/16/23. You will then return monitor and event diary in provided box. It takes 1-2 weeks for report to be downloaded and returned to [place]. We will call you with the results. If monitor falls off or has orange flashing light, please call Zio for further instructions.    Follow-Up: At [facility], you and your health needs are our priority.  As part of our continuing mission to provide you with exceptional heart care, we have created designated Provider Care Teams.  These Care Teams include your primary Cardiologist (physician) and Advanced Practice Providers (APPs -  Physician Assistants and Nurse Practitioners) who all work together to provide you with the care you need, when you need it.  We recommend signing up for the patient portal called "MyChart".  Sign up information is provided on this After Visit Summary.  MyChart is used to connect with patients for Virtual Visits (Telemedicine).  Patients are able to view lab/test results, encounter notes, upcoming appointments, etc.  Non-urgent messages can be sent to your provider as well.   To learn more about what you can do with MyChart, go to [email or web address].    Your next appointment:   Follow up based on results  The format for your next  appointment:   In Person  Provider:   Belva Crome, MD    Other Instructions none  Important Information About Sugar

## 2023-04-09 NOTE — Assessment & Plan Note (Signed)
 Blood pressures suboptimal over the past week at home with systolic ranging between 150s to 170s. Otherwise blood pressure readings have been pretty well-controlled below 150s for the most part. No significant symptoms but does have a sense of extra beat or skipped beat while changing blood pressure as well as heart monitor.  Continue amlodipine 5 mg once daily Continue valsartan 160 mg once daily. Switch metoprolol succinate to carvedilol 3.125 mg twice daily.  Advised to continue monitoring blood pressures. Target blood pressure below 150/90 mmHg considering his advanced age.

## 2023-04-09 NOTE — Assessment & Plan Note (Signed)
 Occasional sense of extra beat or skipped beat, correlating with irregular heartbeat notification on blood pressure monitoring device at home.  Will assess with a heart monitor for 7 days.

## 2023-04-15 ENCOUNTER — Other Ambulatory Visit: Payer: Self-pay | Admitting: Internal Medicine

## 2023-05-03 ENCOUNTER — Ambulatory Visit: Payer: Medicare Other | Admitting: Internal Medicine

## 2023-05-03 ENCOUNTER — Encounter: Payer: Self-pay | Admitting: Internal Medicine

## 2023-05-03 VITALS — BP 182/80 | HR 97 | Temp 98.0°F | Resp 18 | Ht 68.0 in | Wt 166.0 lb

## 2023-05-03 DIAGNOSIS — E7800 Pure hypercholesterolemia, unspecified: Secondary | ICD-10-CM | POA: Diagnosis not present

## 2023-05-03 DIAGNOSIS — N182 Chronic kidney disease, stage 2 (mild): Secondary | ICD-10-CM | POA: Diagnosis not present

## 2023-05-03 DIAGNOSIS — J4520 Mild intermittent asthma, uncomplicated: Secondary | ICD-10-CM

## 2023-05-03 DIAGNOSIS — D631 Anemia in chronic kidney disease: Secondary | ICD-10-CM

## 2023-05-03 DIAGNOSIS — I10 Essential (primary) hypertension: Secondary | ICD-10-CM | POA: Diagnosis not present

## 2023-05-03 MED ORDER — HYDROCHLOROTHIAZIDE 25 MG PO TABS: 25.0000 mg | ORAL_TABLET | Freq: Every day | ORAL | 6 refills | Status: DC

## 2023-05-03 MED ORDER — CARVEDILOL 6.25 MG PO TABS: 6.2500 mg | ORAL_TABLET | Freq: Two times a day (BID) | ORAL | 3 refills | Status: AC

## 2023-05-03 MED ORDER — ROSUVASTATIN CALCIUM 10 MG PO TABS: 10.0000 mg | ORAL_TABLET | Freq: Every day | ORAL | 6 refills | Status: DC

## 2023-05-03 NOTE — Assessment & Plan Note (Signed)
 His asthma symptoms are controlled.

## 2023-05-03 NOTE — Assessment & Plan Note (Addendum)
 His blood pressure is still high, I have added hydrochlorothiazide and it is mentioned allergies but patient does not remember. He will call if he has any problem. I will also increase the dose of coreg to 6.25 mg twice.   I will also enroll in CCM.

## 2023-05-03 NOTE — Assessment & Plan Note (Signed)
 He was told by cardiologist

## 2023-05-03 NOTE — Assessment & Plan Note (Signed)
 He will follow with [name].

## 2023-05-03 NOTE — Progress Notes (Signed)
 Office Visit  Subjective   Patient ID: Jack Turner   DOB: 26-Feb-1931   Age: 89 y.o.   MRN: [id number]   Chief Complaint Chief Complaint  Patient presents with   Follow-up    3 month follow up     History of Present Illness 89 years old male is here for follow up. He says his BP has been very high. He also noted irregular heart beat and he was seen by cardiologist where his BP was very high and they placed Zio patch for 1 week and he is waiting for the result.   He has hypertension and chronic kidney disease. He takes amlodipine 5 mg daily, valsartan 160 mg daily and coreg 3.125 mg twice a day He check his blood pressure at home and it is better but it is high in office.   He follow [name] for anemia and his hemoglobin 11.2 on 12/03/22. His next appointment is next month.   He has asthma and his asthma is much better, he use telergy and take Singulair 10 mg daily.    He also has glaucoma and follow with eye doctor.    He has prostate cancer and he was given seed implant and he is doing much better and follows with Urologist every 6 month. He has hematuria and  he has CT scan abdomen was done. He was treated for antibiotic.   He has borderline diabetes and his last Hb A1c was 6.0%.   He is hard of hearing. He wear hearing aids.    He also has spinal stenosis and he can not stand longer because of pain. He takes tylenol arthritis.   Past Medical History Past Medical History:  Diagnosis Date   Abnormal MRI, thoracic spine 08/26/2021   Anemia in chronic kidney disease 09/05/2021   Anemia of chronic disorder    Arthritis, degenerative    Asthma 01/26/2022   Ataxia    GERD (gastroesophageal reflux disease)    Hard of hearing 02/08/2023   History of prostate cancer    Hypercholesterolemia    Hypertension    Overweight    Screening for diabetes mellitus (DM) 02/08/2023   Seasonal allergies 01/26/2022   Spinal stenosis 02/08/2023     Allergies Allergies   Allergen Reactions   Hydrochlorothiazide Other (See Comments)    Caused renal insufficiency     Review of Systems Review of Systems  Constitutional: Negative.   HENT: Negative.    Respiratory: Negative.    Cardiovascular: Negative.   Gastrointestinal: Negative.   Neurological: Negative.        Objective:    Vitals BP (!) 182/80 (BP Location: Left Arm, Patient Position: Sitting)   Pulse 97   Temp 98 F (36.7 C)   Resp 18   Ht 5\' 8"  (1.727 m)   Wt 166 lb (75.3 kg)   SpO2 98%   BMI 25.24 kg/m    Physical Examination Physical Exam Constitutional:      Appearance: Normal appearance.  Cardiovascular:     Rate and Rhythm: Normal rate and regular rhythm.     Heart sounds: Normal heart sounds.  Pulmonary:     Effort: Pulmonary effort is normal.     Breath sounds: Normal breath sounds.  Abdominal:     General: Bowel sounds are normal.     Palpations: Abdomen is soft.  Neurological:     General: No focal deficit present.     Mental Status: He is alert and oriented to  person, place, and time.        Assessment & Plan:   Hypertension His blood pressure is still high, I have added hydrochlorothiazide and it is mentioned allergies but patient does not remember. He will call if he has any problem. I will also increase the dose of coreg to 6.25 mg twice.   I will also enroll in CCM.  Asthma His asthma symptoms are controlled.   Anemia in chronic kidney disease He will follow with [name].    Return in about 1 month (around 06/02/2023).   Tita Form, MD

## 2023-05-13 ENCOUNTER — Telehealth: Payer: Self-pay

## 2023-05-13 DIAGNOSIS — R002 Palpitations: Secondary | ICD-10-CM

## 2023-05-13 NOTE — Telephone Encounter (Signed)
 Pt requesting cb for monitor results

## 2023-05-13 NOTE — Progress Notes (Signed)
 Please inform him that the results from the heart monitor show average heart rate 72/min.  There are incidences of extra beats from top and bottom chambers of the heart about 1% of the time.  At times these occur back-to-back from top chambers of the heart lasting for up to 19 seconds.  There were no symptoms reported with any of these.  Overall reassuring results.  If he is having any significant symptoms, we can titrate up the dose of carvedilol  and reduce the dose of amlodipine . Thank you

## 2023-05-13 NOTE — Telephone Encounter (Signed)
 Called patient and informed him of his heart monitor results. Patient stated that he is not feeling the extra beats like he did before his appointment with Dr. Ronell Coe. He is also not having any palpitations. Patient states he is feeling fine. Based on the patient's assessment per Dr. Madireddy's recommendation we will not make any changes to his medication at this time. Patient is aware of this and has no further questions at this time.

## 2023-05-31 ENCOUNTER — Encounter: Payer: Self-pay | Admitting: Internal Medicine

## 2023-05-31 ENCOUNTER — Ambulatory Visit: Admitting: Internal Medicine

## 2023-05-31 VITALS — BP 124/70 | HR 83 | Temp 97.9°F | Resp 18 | Ht 68.0 in | Wt 167.0 lb

## 2023-05-31 DIAGNOSIS — J4520 Mild intermittent asthma, uncomplicated: Secondary | ICD-10-CM

## 2023-05-31 DIAGNOSIS — N182 Chronic kidney disease, stage 2 (mild): Secondary | ICD-10-CM

## 2023-05-31 DIAGNOSIS — R7303 Prediabetes: Secondary | ICD-10-CM

## 2023-05-31 DIAGNOSIS — D631 Anemia in chronic kidney disease: Secondary | ICD-10-CM

## 2023-05-31 DIAGNOSIS — I10 Essential (primary) hypertension: Secondary | ICD-10-CM

## 2023-05-31 DIAGNOSIS — M4800 Spinal stenosis, site unspecified: Secondary | ICD-10-CM

## 2023-05-31 DIAGNOSIS — E7800 Pure hypercholesterolemia, unspecified: Secondary | ICD-10-CM

## 2023-05-31 NOTE — Progress Notes (Signed)
 Office Visit  Subjective   Patient ID: Jack Turner   DOB: [birth year]   Age: 89 y.o.   MRN: [id number]   Chief Complaint Chief Complaint  Patient presents with   Primary Hypertension    1 month follow up     History of Present Illness 89 years old male is here for follow up with his daughter.Aaron Aas He says his BP has been very high  But is much better today in our office. He also noted irregular heart beat and he was seen by cardiologist where his BP was very high and they placed Zio patch for 1 week and he is waiting for the result.    He has hypertension and chronic kidney disease. He takes amlodipine  5 mg daily, valsartan  160 mg daily and coreg  3.125 mg twice a day He check his blood pressure at home and it is better but it is high in office.    He follow [name] for anemia and his hemoglobin 11.2 on 12/03/22. His next appointment is next month.   He has asthma and his asthma is much better, he use telergy and take Singulair  10 mg daily.    He also has glaucoma and follow with eye doctor.    He has prostate cancer and he was given seed implant and he is doing much better and follows with Urologist every 6 month. He has hematuria and  he has CT scan abdomen was done. He was treated for antibiotic.    He has borderline diabetes and his last Hb A1c was 6.0%.   He is hard of hearing. He wear hearing aids.    He also has spinal stenosis and he can not stand longer because of pain. He takes tylenol  arthritis.   Past Medical History Past Medical History:  Diagnosis Date   Abnormal MRI, thoracic spine 08/26/2021   Anemia in chronic kidney disease 09/05/2021   Anemia of chronic disorder    Arthritis, degenerative    Asthma 01/26/2022   Ataxia    GERD (gastroesophageal reflux disease)    Hard of hearing 02/08/2023   History of prostate cancer    Hypercholesterolemia    Hypertension    Overweight    Screening for diabetes mellitus (DM) 02/08/2023   Seasonal allergies  01/26/2022   Spinal stenosis 02/08/2023     Allergies Allergies  Allergen Reactions   Hydrochlorothiazide  Other (See Comments)    Caused renal insufficiency     Review of Systems Review of Systems  Constitutional: Negative.   Respiratory: Negative.    Cardiovascular: Negative.   Musculoskeletal:  Positive for back pain and joint pain.  Neurological: Negative.        Objective:    Vitals BP 124/70 (BP Location: Left Arm, Patient Position: Sitting, Cuff Size: Normal)   Pulse 83   Temp 97.9 F (36.6 C)   Resp 18   Ht 5\' 8"  (1.727 m)   Wt 167 lb (75.8 kg)   SpO2 98%   BMI 25.39 kg/m    Physical Examination Physical Exam Constitutional:      Appearance: Normal appearance.  Cardiovascular:     Rate and Rhythm: Normal rate and regular rhythm.     Heart sounds: Normal heart sounds.  Pulmonary:     Effort: Pulmonary effort is normal.     Breath sounds: Normal breath sounds.  Abdominal:     General: Bowel sounds are normal.     Palpations: Abdomen is soft.  Neurological:  General: No focal deficit present.     Mental Status: He is alert and oriented to person, place, and time.        Assessment & Plan:   Hypertension  His blood pressure is controlled.  Anemia in chronic kidney disease   He will continue to follow up with Hematology  Asthma  His asthma symptoms are better.  Spinal stenosis   He has lumbar spinal stenosis and cannot stand longer because of pain.  He takes Tylenol  arthritis for pain.  Hypercholesterolemia   He take rosuvastatin  10 mg daily without any side effect.  Borderline diabetes mellitus   Will continue to monitor.    Return in about 2 months (around 07/31/2023).   Tita Form, MD

## 2023-06-02 NOTE — Progress Notes (Deleted)
 [facility]  [address] [place],  [place]  [address] [phone]  Clinic Day:  12/03/2022  Referring physician: Tita Form, MD   HISTORY OF PRESENT ILLNESS:  The patient is a 89 y.o. male with anemia secondary to chronic renal insufficiency.  Over these past months, his hemoglobin has consistently been above 10 to where red cell shot therapy has not been necessary.  He comes in today for routine follow-up.  Since his last visit, the patient has been doing well.  He denies having increased fatigue or any overt forms of blood loss which concern him for progressive anemia.   PHYSICAL EXAM:  There were no vitals taken for this visit. Wt Readings from Last 3 Encounters:  05/31/23 167 lb (75.8 kg)  05/03/23 166 lb (75.3 kg)  04/09/23 164 lb 12.8 oz (74.8 kg)   There is no height or weight on file to calculate BMI. Performance status (ECOG): 1 - Symptomatic but completely ambulatory Physical Exam Constitutional:      Appearance: Normal appearance. He is not ill-appearing.  HENT:     Mouth/Throat:     Mouth: Mucous membranes are moist.     Pharynx: Oropharynx is clear. No oropharyngeal exudate or posterior oropharyngeal erythema.  Cardiovascular:     Rate and Rhythm: Normal rate and regular rhythm.     Heart sounds: No murmur heard.    No friction rub. No gallop.  Pulmonary:     Effort: Pulmonary effort is normal. No respiratory distress.     Breath sounds: Normal breath sounds. No wheezing, rhonchi or rales.  Abdominal:     General: Bowel sounds are normal. There is no distension.     Palpations: Abdomen is soft. There is no mass.     Tenderness: There is no abdominal tenderness.  Musculoskeletal:        General: No swelling.     Right lower leg: No edema.     Left lower leg: No edema.  Lymphadenopathy:     Cervical: No cervical adenopathy.     Upper Body:     Right upper body: No supraclavicular or axillary adenopathy.     Left upper  body: No supraclavicular or axillary adenopathy.     Lower Body: No right inguinal adenopathy. No left inguinal adenopathy.  Skin:    General: Skin is warm.     Coloration: Skin is not jaundiced.     Findings: No lesion or rash.  Neurological:     General: No focal deficit present.     Mental Status: He is alert and oriented to person, place, and time. Mental status is at baseline.  Psychiatric:        Mood and Affect: Mood normal.        Behavior: Behavior normal.        Thought Content: Thought content normal.     LABS:      Latest Ref Rng & Units 12/03/2022    1:12 PM 06/01/2022   12:00 AM 03/31/2022    1:08 PM  CBC  WBC 4.0 - 10.5 K/uL 10.2  7.4     8.9   Hemoglobin 13.0 - 17.0 g/dL 91.4  78.2     95.6   Hematocrit 39.0 - 52.0 % 33.2  31     33.1   Platelets 150 - 400 K/uL 316  292     299      This result is from an external source.  Latest Ref Rng & Units 02/08/2023    2:37 PM 12/03/2022    1:12 PM 06/01/2022    1:58 PM  CMP  Glucose 70 - 99 mg/dL 92  94  782   BUN 10 - 36 mg/dL 28  31  26    Creatinine 0.76 - 1.27 mg/dL 9.56  2.13  0.86   Sodium 134 - 144 mmol/L 144  139  139   Potassium 3.5 - 5.2 mmol/L 4.9  4.5  3.9   Chloride 96 - 106 mmol/L 110  105  109   CO2 20 - 29 mmol/L 21  23  22    Calcium  8.6 - 10.2 mg/dL 8.8  9.5  8.3   Total Protein 6.0 - 8.5 g/dL 7.3  8.2  7.2   Total Bilirubin 0.0 - 1.2 mg/dL 0.4  0.4  0.5   Alkaline Phos 44 - 121 IU/L 98  98  78   AST 0 - 40 IU/L 24  24  23    ALT 0 - 44 IU/L 17  10  16      Latest Reference Range & Units 12/03/22 13:12  Iron 45 - 182 ug/dL 65  UIBC ug/dL 578  TIBC 469 - 629 ug/dL 528  Saturation Ratios 17.9 - 39.5 % 22  Ferritin 24 - 336 ng/mL 37    ASSESSMENT & PLAN:  Assessment/Plan:  A 89 y.o. male with anemia secondary to chronic renal insufficiency.  I am pleased as this gentleman's hemoglobin remains well above 10.  Based upon this, red cell shot therapy remains unnecessary at this time.  As his  hemoglobin has held stable and he is clinically doing well, I will see him back in 6 months for repeat clinical assessment. The patient understands all the plans discussed today and is in agreement with them.     Corry Storie Felicia Horde, MD

## 2023-06-03 ENCOUNTER — Inpatient Hospital Stay: Payer: Medicare Other | Admitting: Oncology

## 2023-06-03 ENCOUNTER — Ambulatory Visit (HOSPITAL_BASED_OUTPATIENT_CLINIC_OR_DEPARTMENT_OTHER): Admission: EM | Admit: 2023-06-03 | Discharge: 2023-06-03 | Disposition: A | Discharge status: Home / Self Care

## 2023-06-03 ENCOUNTER — Encounter (HOSPITAL_BASED_OUTPATIENT_CLINIC_OR_DEPARTMENT_OTHER): Payer: Self-pay

## 2023-06-03 ENCOUNTER — Inpatient Hospital Stay: Payer: Medicare Other

## 2023-06-03 DIAGNOSIS — R531 Weakness: Secondary | ICD-10-CM | POA: Diagnosis not present

## 2023-06-03 DIAGNOSIS — E860 Dehydration: Secondary | ICD-10-CM | POA: Diagnosis not present

## 2023-06-03 DIAGNOSIS — R197 Diarrhea, unspecified: Secondary | ICD-10-CM | POA: Diagnosis not present

## 2023-06-03 MED ORDER — SODIUM CHLORIDE 0.9 % IV BOLUS
500.0000 mL | Freq: Once | INTRAVENOUS | Status: AC
  Administered 2023-06-03: 500 mL via INTRAVENOUS

## 2023-06-03 NOTE — ED Provider Notes (Signed)
 [facility]    CSN: [id number] Arrival date & time: 06/03/23  1137      History   Chief Complaint Chief Complaint  Patient presents with   Hematuria    HPI Rawlings Scher is a 89 y.o. male.   Patient is a 89 year old male that presents today with diarrhea, weakness.  Was seen previously at his doctor about 3 days ago and diagnosed with UTI.  He was then prescribed amoxicillin to treat.  He has been taking this the past couple days.  Since he has noticed watery diarrhea, loss of appetite and urinary retention.  He is having some generalized stomach cramping and back pain.  He has been drinking fluids but reports every time he drinks fluids he has watery diarrhea.  He did drink some cranberry juice and went and took a nap and woke up and had incontinence.  He is concerned due to not urinating very much.  He does have a low-grade fever today.  Will schedule with the cancer center for routine checkup today but had to cancel due to him not feeling well.   Hematuria    Past Medical History:  Diagnosis Date   Abnormal MRI, thoracic spine 08/26/2021   Anemia in chronic kidney disease 09/05/2021   Anemia of chronic disorder    Arthritis, degenerative    Asthma 01/26/2022   Ataxia    GERD (gastroesophageal reflux disease)    Hard of hearing 02/08/2023   History of prostate cancer    Hypercholesterolemia    Hypertension    Overweight    Screening for diabetes mellitus (DM) 02/08/2023   Seasonal allergies 01/26/2022   Spinal stenosis 02/08/2023    Patient Active Problem List   Diagnosis Date Noted   Palpitations 04/09/2023   Spinal stenosis 02/08/2023   Screening for diabetes mellitus (DM) 02/08/2023   Hard of hearing 02/08/2023   Need for prophylactic vaccination and inoculation against influenza 11/02/2022   Seasonal allergies 01/26/2022   Asthma 01/26/2022   Anemia in chronic kidney disease 09/05/2021   Abnormal MRI, thoracic spine 08/26/2021   Anemia of  chronic disorder 12/19/2020   Arthritis, degenerative 12/19/2020   Ataxia 12/19/2020   GERD (gastroesophageal reflux disease) 12/19/2020   History of prostate cancer 12/19/2020   Hypercholesterolemia 12/19/2020   Hypertension 12/19/2020   Overweight 12/19/2020    Past Surgical History:  Procedure Laterality Date   CATARACT EXTRACTION Right    INGUINAL HERNIA REPAIR Right 08/03/2006   Performed by Dr. Augie Leaven at [facility]   PROSTATE SURGERY     TOTAL HIP ARTHROPLASTY Bilateral 02/23/1996       Home Medications    Prior to Admission medications   Medication Sig Start Date End Date Taking? Authorizing Provider  amoxicillin-clavulanate (AUGMENTIN) 500-125 MG tablet Take 1 tablet by mouth 2 (two) times daily. 06/01/23  Yes [provider]  ALPHAGAN P 0.1 % SOLN Place 1 drop into the left eye 2 (two) times daily. 02/14/20   [provider]  amLODipine  (NORVASC ) 5 MG tablet TAKE 1 TABLET BY MOUTH EVERY DAY 01/26/23   Tita Form, MD  b complex vitamins capsule Take 1 capsule by mouth every other day.    [provider]  calcium -vitamin D (OSCAL WITH D) 500-5 MG-MCG tablet Take 1 tablet by mouth daily.    [provider]  carvedilol  (COREG ) 6.25 MG tablet Take 1 tablet (6.25 mg total) by mouth 2 (two) times daily. 05/03/23 08/01/23  Amin, Saad, MD  dorzolamide (  TRUSOPT) 2 % ophthalmic solution Place 1 drop into the left eye 2 (two) times daily. 02/19/20   [provider]  fluticasone  (FLONASE ) 50 MCG/ACT nasal spray SPRAY 2 SPRAYS INTO EACH NOSTRIL EVERY DAY 04/15/23   Amin, Saad, MD  hydrochlorothiazide  (HYDRODIURIL ) 25 MG tablet Take 1 tablet (25 mg total) by mouth daily. 05/03/23   Amin, Saad, MD  latanoprost (XALATAN) 0.005 % ophthalmic solution Place 1 drop into both eyes 2 (two) times daily. 02/14/20   [provider]  Misc Natural Products (URINOZINC PO) Take 1 tablet by mouth daily. Strength unknown    [provider]  montelukast   (SINGULAIR ) 10 MG tablet TAKE 1 TABLET BY MOUTH EVERY DAY 10/29/22   Tita Form, MD  Multiple Vitamin (MULTIVITAMIN) tablet Take 1 tablet by mouth daily.    [provider]  Multiple Vitamins-Minerals (EYE HEALTH PO) Take 1 tablet by mouth daily.    [provider]  rosuvastatin  (CRESTOR ) 10 MG tablet Take 1 tablet (10 mg total) by mouth daily. 05/03/23   Amin, Saad, MD  SYSTANE BALANCE 0.6 % SOLN Place 1 drop into both eyes as needed.    [provider]  TRELEGY ELLIPTA  100-62.5-25 MCG/ACT AEPB TAKE 1 PUFF BY MOUTH EVERY DAY 01/25/23   Tita Form, MD  valsartan  (DIOVAN ) 160 MG tablet Take 1 tablet (160 mg total) by mouth daily. 02/08/23   Tita Form, MD    Family History Family History  Problem Relation Age of Onset   Other Brother        Died in [employer] accident    Social History Social History   Tobacco Use   Smoking status: Former    Current packs/day: 0.00    Types: Cigarettes    Quit date: 1992    Years since quitting: 33.3   Smokeless tobacco: Never  Vaping Use   Vaping status: Never Used  Substance Use Topics   Alcohol use: Yes    Comment: occasional/ 1 every 2-3 months   Drug use: Never     Allergies   Hydrochlorothiazide    Review of Systems Review of Systems  Genitourinary:  Positive for hematuria.   See HPI  Physical Exam Triage Vital Signs ED Triage Vitals  Encounter Vitals Group     BP 06/03/23 1152 (!) 141/70     Systolic BP Percentile --      Diastolic BP Percentile --      Pulse Rate 06/03/23 1152 77     Resp 06/03/23 1152 20     Temp 06/03/23 1152 99.1 F (37.3 C)     Temp Source 06/03/23 1152 Oral     SpO2 06/03/23 1152 95 %     Weight --      Height --      Head Circumference --      Peak Flow --      Pain Score 06/03/23 1154 0     Pain Loc --      Pain Education --      Exclude from Growth Chart --    No data found.  Updated Vital Signs BP 139/64 (BP Location: Right Arm)   Pulse 73   Temp 99.1 F (37.3 C)  (Oral)   Resp 20   SpO2 95%   Visual Acuity Right Eye Distance:   Left Eye Distance:   Bilateral Distance:    Right Eye Near:   Left Eye Near:    Bilateral Near:     Physical Exam Constitutional:  Appearance: Normal appearance.  Eyes:     Conjunctiva/sclera: Conjunctivae normal.  Cardiovascular:     Rate and Rhythm: Normal rate and regular rhythm.  Pulmonary:     Effort: Pulmonary effort is normal.     Breath sounds: Normal breath sounds.  Musculoskeletal:        General: Normal range of motion.  Skin:    General: Skin is warm and dry.  Neurological:     Mental Status: He is alert.  Psychiatric:        Mood and Affect: Mood normal.      UC Treatments / Results  Labs (all labs ordered are listed, but only abnormal results are displayed) Labs Reviewed  COMPREHENSIVE METABOLIC PANEL WITH GFR  CBC WITH DIFFERENTIAL/PLATELET    EKG   Radiology No results found.  Procedures Procedures (including critical care time)  Medications Ordered in UC Medications  sodium chloride 0.9 % bolus 500 mL (0 mLs Intravenous Stopped 06/03/23 1314)    Initial Impression / Assessment and Plan / UC Course  I have reviewed the triage vital signs and the nursing notes.  Pertinent labs & imaging results that were available during my care of the patient were reviewed by me and considered in my medical decision making (see chart for details).     89 year old male comes in today with weakness, potential dehydration from diarrhea.  He does have low-grade fever here. Currently being treated for UTI.   Not sure if this is some sort of stomach virus or potentially from the antibiotics or the UTI.  Recommended that he take a probiotic and sip fluids to stay hydrated. Blood work obtained here to include CBC with differential and CMP.  Rehydrated him with 500 cc bolus of normal saline.  Patient feeling slightly better before leaving.  Vital signs stable. Advance diet as  tolerated. Reassured that we will call with any abnormal results of the blood work or he can give us  a call tomorrow afternoon to see if the results are back. Advance diet as tolerated with bland foods ER for worsening symptoms Final Clinical Impressions(s) / UC Diagnoses   Final diagnoses:  Weakness  Dehydration  Diarrhea, unspecified type     Discharge Instructions      We have given you some fluids today to hopefully help rehydrate you.  I also did some blood work and we will call you with any abnormal results on this. Make sure you are sipping on Gatorade and electrolytes to stay hydrated If symptoms worsen you will need to go to the ER. Otherwise follow-up with your doctor as planned [employer] [employer] for now  ED Prescriptions   None    PDMP not reviewed this encounter.   Landa Pine, FNP 06/03/23 1402

## 2023-06-03 NOTE — Discharge Instructions (Signed)
 We have given you some fluids today to hopefully help rehydrate you.  I also did some blood work and we will call you with any abnormal results on this. Make sure you are sipping on Gatorade and electrolytes to stay hydrated If symptoms worsen you will need to go to the ER. Otherwise follow-up with your doctor as planned [employer] for now

## 2023-06-03 NOTE — ED Triage Notes (Addendum)
 States dx with UTI several weeks ago. Also had CT which showed inflammation of kidney. Is awaiting a procedure to further investigate that. Treated with antibiotic for a UTI and was slightly better. States repeat urinalysis earlier this week showed a UTI. Patient given new antibiotic and now having pain to abdomen, back, diarrhea. States feels like unable to void because he doesn't have fluids in him. Has not eaten in last 2 days. Has had difficulty urinating since yesterday.

## 2023-06-04 ENCOUNTER — Ambulatory Visit (HOSPITAL_COMMUNITY): Payer: Self-pay

## 2023-06-04 LAB — COMPREHENSIVE METABOLIC PANEL WITH GFR
ALT: 17 IU/L (ref 0–44)
AST: 29 IU/L (ref 0–40)
Albumin: 3 g/dL — ABNORMAL LOW (ref 3.6–4.6)
Alkaline Phosphatase: 93 IU/L (ref 44–121)
BUN/Creatinine Ratio: 28 — ABNORMAL HIGH (ref 10–24)
BUN: 80 mg/dL (ref 10–36)
Bilirubin Total: 0.3 mg/dL (ref 0.0–1.2)
CO2: 16 mmol/L — ABNORMAL LOW (ref 20–29)
Calcium: 7.8 mg/dL — ABNORMAL LOW (ref 8.6–10.2)
Chloride: 100 mmol/L (ref 96–106)
Creatinine, Ser: 2.88 mg/dL — ABNORMAL HIGH (ref 0.76–1.27)
Globulin, Total: 3.8 g/dL (ref 1.5–4.5)
Glucose: 108 mg/dL — ABNORMAL HIGH (ref 70–99)
Potassium: 4.6 mmol/L (ref 3.5–5.2)
Sodium: 136 mmol/L (ref 134–144)
Total Protein: 6.8 g/dL (ref 6.0–8.5)
eGFR: 20 mL/min/{1.73_m2} — ABNORMAL LOW (ref >59)

## 2023-06-04 LAB — CBC WITH DIFFERENTIAL/PLATELET
Basophils Absolute: 0 10*3/uL (ref 0.0–0.2)
Basos: 0 %
EOS (ABSOLUTE): 0 10*3/uL (ref 0.0–0.4)
Eos: 0 %
Hematocrit: 29.3 % — ABNORMAL LOW (ref 37.5–51.0)
Hemoglobin: 9.4 g/dL — ABNORMAL LOW (ref 13.0–17.7)
Immature Grans (Abs): 0.1 10*3/uL (ref 0.0–0.1)
Immature Granulocytes: 1 %
Lymphocytes Absolute: 0.5 10*3/uL — ABNORMAL LOW (ref 0.7–3.1)
Lymphs: 4 %
MCH: 29.1 pg (ref 26.6–33.0)
MCHC: 32.1 g/dL (ref 31.5–35.7)
MCV: 91 fL (ref 79–97)
Monocytes Absolute: 0.9 10*3/uL (ref 0.1–0.9)
Monocytes: 7 %
Neutrophils Absolute: 11.4 10*3/uL — ABNORMAL HIGH (ref 1.4–7.0)
Neutrophils: 88 %
Platelets: 268 10*3/uL (ref 150–450)
RBC: 3.23 x10E6/uL — ABNORMAL LOW (ref 4.14–5.80)
RDW: 14.2 % (ref 11.6–15.4)
WBC: 12.8 10*3/uL — ABNORMAL HIGH (ref 3.4–10.8)

## 2023-06-19 DIAGNOSIS — R7303 Prediabetes: Secondary | ICD-10-CM

## 2023-06-19 HISTORY — DX: Prediabetes: R73.03

## 2023-06-19 NOTE — Assessment & Plan Note (Signed)
 Will continue to monitor.

## 2023-06-19 NOTE — Assessment & Plan Note (Signed)
He will continue to follow up with Hematology.

## 2023-06-19 NOTE — Assessment & Plan Note (Signed)
 He take rosuvastatin  10 mg daily without any side effect.

## 2023-06-19 NOTE — Assessment & Plan Note (Signed)
 He has lumbar spinal stenosis and cannot stand longer because of pain.  He takes Tylenol  arthritis for pain.

## 2023-06-19 NOTE — Assessment & Plan Note (Signed)
His blood pressure is controlled.

## 2023-06-19 NOTE — Assessment & Plan Note (Signed)
 His asthma symptoms are better.

## 2023-06-21 ENCOUNTER — Encounter: Payer: Self-pay | Admitting: Internal Medicine

## 2023-06-21 ENCOUNTER — Ambulatory Visit: Admitting: Internal Medicine

## 2023-06-21 VITALS — BP 124/64 | HR 78 | Temp 97.9°F | Resp 18 | Ht 68.0 in | Wt 158.5 lb

## 2023-06-21 DIAGNOSIS — N19 Unspecified kidney failure: Secondary | ICD-10-CM | POA: Insufficient documentation

## 2023-06-21 DIAGNOSIS — I10 Essential (primary) hypertension: Secondary | ICD-10-CM

## 2023-06-21 DIAGNOSIS — N178 Other acute kidney failure: Secondary | ICD-10-CM

## 2023-06-21 DIAGNOSIS — N139 Obstructive and reflux uropathy, unspecified: Secondary | ICD-10-CM | POA: Insufficient documentation

## 2023-06-21 HISTORY — DX: Unspecified kidney failure: N19

## 2023-06-21 HISTORY — DX: Obstructive and reflux uropathy, unspecified: N13.9

## 2023-06-21 NOTE — Assessment & Plan Note (Signed)
 He has acute renal failure due to obstruction.  I will repeat BMP today.

## 2023-06-21 NOTE — Assessment & Plan Note (Signed)
 He has an appointment with urologist this week.

## 2023-06-21 NOTE — Assessment & Plan Note (Signed)
 His blood pressure is normal.  He will continue with valsartan  160 mg daily, carvedilol  6.25 mg twice a day and amlodipine  5 mg daily

## 2023-06-21 NOTE — Progress Notes (Signed)
 Office Visit  Subjective   Patient ID: Jack Turner   DOB: 18-Sep-1931   Age: 89 y.o.   MRN: [id number]   Chief Complaint Chief Complaint  Patient presents with   Hospitalization Follow-up    Follow up     History of Present Illness   89 years old male who was admitted to [facility] on May 16 and discharged home on May 20th.  He has acute renal failure will BUN was 90 with creatinine 2.7.  He was given IV fluid and empirically started him on Rocephin .  His creatinine was 1.1 in April 2025. He has 3+ blood in his urine.  Ultrasound showed bilateral severe hydronephrosis and hydroureter.  Dr. Reginal Capra recommended 3 way catheter and irrigation and kidney function got better.  His urine analysis was suggested of urinary tract infection.  He was started on Flomax and  Proscar 5 mg daily.  Foley's catheter was removed and he says that he is urinating well.  Post void bladder scan shows 0 cc in the bladder. He has an appointment with Dr. Reginal Capra next week.  He is here for transition of care visit.  He says that he is feeling better.  I have reviewed with him medications and I have reviewed hospital discharge summary.  His creatinine at time of discharge was 1.4 that was still higher than his baseline.  His blood pressure was low in hospital and his blood pressure medications were held but he restarted his blood pressure medications.  His blood pressure is normal today.  Past Medical History Past Medical History:  Diagnosis Date   Abnormal MRI, thoracic spine 08/26/2021   Anemia in chronic kidney disease 09/05/2021   Anemia of chronic disorder    Arthritis, degenerative    Asthma 01/26/2022   Ataxia    GERD (gastroesophageal reflux disease)    Hard of hearing 02/08/2023   History of prostate cancer    Hypercholesterolemia    Hypertension    Overweight    Screening for diabetes mellitus (DM) 02/08/2023   Seasonal allergies 01/26/2022   Spinal stenosis 02/08/2023      Allergies Allergies  Allergen Reactions   Hydrochlorothiazide  Other (See Comments)    Caused renal insufficiency     Review of Systems Review of Systems  Constitutional: Negative.   HENT: Negative.    Respiratory: Negative.    Cardiovascular: Negative.   Gastrointestinal: Negative.   Neurological: Negative.        Objective:    Vitals BP 124/64   Pulse 78   Temp 97.9 F (36.6 C)   Resp 18   Ht 5\' 8"  (1.727 m)   Wt 158 lb 8 oz (71.9 kg)   SpO2 95%   BMI 24.10 kg/m    Physical Examination Physical Exam Constitutional:      Appearance: Normal appearance.  HENT:     Head: Normocephalic and atraumatic.  Cardiovascular:     Rate and Rhythm: Normal rate and regular rhythm.     Heart sounds: Normal heart sounds.  Pulmonary:     Effort: Pulmonary effort is normal.     Breath sounds: Normal breath sounds.  Abdominal:     General: Bowel sounds are normal.     Palpations: Abdomen is soft.  Neurological:     General: No focal deficit present.     Mental Status: He is alert and oriented to person, place, and time.        Assessment & Plan:   Hypertension  His blood pressure is normal.  He will continue with valsartan  160 mg daily, carvedilol  6.25 mg twice a day and amlodipine  5 mg daily  Renal failure syndrome  He has acute renal failure due to obstruction.  I will repeat BMP today.  Obstructed, uropathy   He has an appointment with urologist this week.     This is a transition of care visit.  I have reviewed medications with him.  No follow-ups on file.   Tita Form, MD

## 2023-06-22 LAB — BASIC METABOLIC PANEL WITH GFR
BUN/Creatinine Ratio: 29 — ABNORMAL HIGH (ref 10–24)
BUN: 33 mg/dL (ref 10–36)
CO2: 19 mmol/L — ABNORMAL LOW (ref 20–29)
Calcium: 8 mg/dL — ABNORMAL LOW (ref 8.6–10.2)
Chloride: 105 mmol/L (ref 96–106)
Creatinine, Ser: 1.13 mg/dL (ref 0.76–1.27)
Glucose: 82 mg/dL (ref 70–99)
Potassium: 4.4 mmol/L (ref 3.5–5.2)
Sodium: 137 mmol/L (ref 134–144)
eGFR: 62 mL/min/{1.73_m2} (ref >59)

## 2023-06-25 ENCOUNTER — Ambulatory Visit: Payer: Self-pay

## 2023-06-27 ENCOUNTER — Other Ambulatory Visit: Payer: Self-pay | Admitting: Internal Medicine

## 2023-07-08 DIAGNOSIS — N178 Other acute kidney failure: Secondary | ICD-10-CM | POA: Diagnosis not present

## 2023-07-08 DIAGNOSIS — I10 Essential (primary) hypertension: Secondary | ICD-10-CM | POA: Diagnosis not present

## 2023-07-08 DIAGNOSIS — N139 Obstructive and reflux uropathy, unspecified: Secondary | ICD-10-CM

## 2023-07-22 ENCOUNTER — Other Ambulatory Visit: Payer: Self-pay | Admitting: Internal Medicine

## 2023-08-02 ENCOUNTER — Ambulatory Visit: Admitting: Internal Medicine

## 2023-08-03 ENCOUNTER — Other Ambulatory Visit: Payer: Self-pay | Admitting: Internal Medicine

## 2023-08-13 ENCOUNTER — Ambulatory Visit: Payer: Self-pay | Admitting: Internal Medicine

## 2023-08-13 ENCOUNTER — Encounter: Payer: Self-pay | Admitting: Internal Medicine

## 2023-08-13 VITALS — BP 122/78 | HR 67 | Temp 97.9°F | Resp 18 | Ht 66.0 in | Wt 160.4 lb

## 2023-08-13 DIAGNOSIS — I10 Essential (primary) hypertension: Secondary | ICD-10-CM | POA: Diagnosis not present

## 2023-08-13 DIAGNOSIS — E7800 Pure hypercholesterolemia, unspecified: Secondary | ICD-10-CM

## 2023-08-13 DIAGNOSIS — J4520 Mild intermittent asthma, uncomplicated: Secondary | ICD-10-CM

## 2023-08-13 DIAGNOSIS — R7303 Prediabetes: Secondary | ICD-10-CM

## 2023-08-13 NOTE — Progress Notes (Signed)
 Office Visit  Subjective   Patient ID: Jack Turner   DOB: February 23, 1931   Age: 89 y.o.   MRN: [id number]   Chief Complaint Chief Complaint  Patient presents with   Follow-up   Anemia    2 month follow up     History of Present Illness 89 years old male is here for follow up with his daughter.  He brought his blood pressure reading from home and his blood pressure is much better.  He takes 4 blood pressure medications.  He denies any side effect. He takes amlodipine  5 mg daily, valsartan  160 mg daily and coreg  6.25 mg twice a day and hydrochlorothiazide  daily.   He says that he saw urologist and he was told that he still has infection and he was given antibiotic.  He has finish antibiotic course.  He has urination is better now.   He has prostate cancer and seeds were implant.   He follow Dr. Ezzard for anemia and his hemoglobin 11.2 on 12/03/22.   He has asthma and his asthma is much better, he use telergy and take Singulair  10 mg daily.    He also has glaucoma and follow with eye doctor.    He has borderline diabetes and his last Hb A1c was 6.0%.   He is hard of hearing. He wear hearing aids.    He also has spinal stenosis and he can not stand longer because of pain. He takes tylenol  arthritis.   Past Medical History Past Medical History:  Diagnosis Date   Abnormal MRI, thoracic spine 08/26/2021   Anemia in chronic kidney disease 09/05/2021   Anemia of chronic disorder    Arthritis, degenerative    Asthma 01/26/2022   Ataxia    GERD (gastroesophageal reflux disease)    Hard of hearing 02/08/2023   History of prostate cancer    Hypercholesterolemia    Hypertension    Overweight    Screening for diabetes mellitus (DM) 02/08/2023   Seasonal allergies 01/26/2022   Spinal stenosis 02/08/2023     Allergies Allergies  Allergen Reactions   Hydrochlorothiazide  Other (See Comments)    Caused renal insufficiency     Review of Systems Review of Systems   Constitutional: Negative.   HENT: Negative.    Respiratory: Negative.    Cardiovascular: Negative.   Gastrointestinal: Negative.   Neurological: Negative.        Objective:    Vitals BP 122/78   Pulse 67   Temp 97.9 F (36.6 C)   Resp 18   Ht 5' 6 (1.676 m)   Wt 160 lb 6 oz (72.7 kg)   SpO2 96%   BMI 25.89 kg/m    Physical Examination Physical Exam Constitutional:      Appearance: Normal appearance.  Cardiovascular:     Rate and Rhythm: Normal rate and regular rhythm.     Heart sounds: Normal heart sounds.  Pulmonary:     Effort: Pulmonary effort is normal.     Breath sounds: Normal breath sounds.  Abdominal:     General: Bowel sounds are normal.     Palpations: Abdomen is soft.  Neurological:     General: No focal deficit present.     Mental Status: He is alert and oriented to person, place, and time.        Assessment & Plan:   Hypertension   Better  Asthma  His asthma is controlled.  He takes montelukast  and Trelegy inhaler.  Hypercholesterolemia  He takes rosuvastatin  10 mg daily without any side effects.  Borderline diabetes mellitus   His last hemoglobin A1c was 6.0.    Return in about 3 months (around 11/13/2023).   Roetta Dare, MD

## 2023-08-13 NOTE — Assessment & Plan Note (Signed)
 He takes rosuvastatin  10 mg daily without any side effects.

## 2023-08-13 NOTE — Assessment & Plan Note (Signed)
 His last hemoglobin A1c was 6.0.

## 2023-08-13 NOTE — Assessment & Plan Note (Signed)
 His asthma is controlled.  He takes montelukast  and Trelegy inhaler.

## 2023-08-13 NOTE — Assessment & Plan Note (Signed)
 Better

## 2023-09-06 ENCOUNTER — Telehealth: Payer: Self-pay | Admitting: Cardiology

## 2023-09-06 NOTE — Telephone Encounter (Signed)
 Patient c/o Palpitations:  STAT if patient reporting lightheadedness, shortness of breath, or chest pain  How long have you had palpitations/irregular HR/ Afib? Are you having the symptoms now? 3 days, yes  Are you currently experiencing lightheadedness, SOB or CP? no  Do you have a history of afib (atrial fibrillation) or irregular heart rhythm? yes  Have you checked your BP or HR? (document readings if available): doesn't know   Are you experiencing any other symptoms? no

## 2023-09-06 NOTE — Telephone Encounter (Signed)
 The patient's daughter came to the office with her boyfriend for his visit and showed Dr. Liborio the patient's blood pressure and heart monitor results:  09/04/23  148/78 HR 72 irregular 143/79 HR 72 irregular  09/05/23  132/66 HR 68 irregular 124/68 HR 67  09/06/23  132/70 HR 67 irregular 128/66 HR 66 irregular   After reviewing the blood pressure results above Dr. Maddireddy recommended that they make a follow up appointment for the patient. The patients daughter verbalized understanding and had no further questions at this time. An appointment was made for the patient with Dr. Liborio on 09/24/23 at 11:00 am.

## 2023-09-06 NOTE — Telephone Encounter (Signed)
 Left message for the patient to call back.

## 2023-09-24 ENCOUNTER — Ambulatory Visit

## 2023-09-24 VITALS — BP 172/80 | HR 70 | Ht 66.0 in | Wt 162.8 lb

## 2023-09-24 DIAGNOSIS — I10 Essential (primary) hypertension: Secondary | ICD-10-CM | POA: Diagnosis present

## 2023-09-24 DIAGNOSIS — I471 Supraventricular tachycardia, unspecified: Secondary | ICD-10-CM | POA: Insufficient documentation

## 2023-09-24 DIAGNOSIS — I493 Ventricular premature depolarization: Secondary | ICD-10-CM | POA: Insufficient documentation

## 2023-09-24 NOTE — Assessment & Plan Note (Signed)
 Well-controlled, based on his blood pressure readings from home. Slightly elevated today in the office today. At this time we will hold off on escalating regimen. Continue with amlodipine  5 mg once daily Continue with carvedilol  6.25 mg twice daily Continue valsartan  160 mg once daily Continue hydrochlorothiazide  25 mg once daily. Target below 140/80 mmHg.

## 2023-09-24 NOTE — Progress Notes (Signed)
 Cardiology Consultation:    Date:  09/24/2023   ID:  Jack Turner, DOB 02/10/1931, MRN [id number]  PCP:  Jack Dirks, MD  Cardiologist:  Jack SAUNDERS Khara Renaud, MD   Referring MD: Jack Dirks, MD   No chief complaint on file.    ASSESSMENT AND PLAN:   [name] 89/M  history of hypertension, mild mitral regurgitation, hyperlipidemia, palpitations, chronic kidney disease stage II, anemia, asthma, prostate cancer, chronic back pain, history of lumbar spinal stenosis, with 7-day Zio patch monitor from March 2025 noted occasional ventricular ectopy burden 1.1% and short runs of SVT with the longest episode lasting 19 seconds [ asymptomatic without patient triggered events], now here for follow-up visit after he noted his blood pressure machine at home identifying irregular rhythm and alerting him over the past month on various days and continues to remain asymptomatic.   Problem List Items Addressed This Visit     Hypertension   Well-controlled, based on his blood pressure readings from home. Slightly elevated today in the office today. At this time we will hold off on escalating regimen. Continue with amlodipine  5 mg once daily Continue with carvedilol  6.25 mg twice daily Continue valsartan  160 mg once daily Continue hydrochlorothiazide  25 mg once daily. Target below 140/80 mmHg.       Ventricular ectopy   Additional burden 1.1% on Zio patch 7-day study March 2025. Asymptomatic. Alert of irregular rhythm noted on his blood pressure cuff likely relates to this versus supraventricular ectopy/SVT. However he remains asymptomatic.        Relevant Orders   EKG 12-Lead (Completed)   SVT (supraventricular tachycardia) (HCC) - Primary   Paroxysmal runs of SVT, asymptomatic, longest episode noted 19 seconds on Zio patch monitor 7-day study March 2025. Reassured him about the findings. If he does have any symptoms associated with the irregular heartbeat notifications or if he is  starting to feel palpitations, lightheadedness, dizziness, will repeat heart monitor and based on the findings may consider titrating the beta-blockers or add antiarrhythmics.       Relevant Orders   EKG 12-Lead (Completed)   Reassured him about the findings. If he does have any symptoms associated with the irregular heartbeat notifications or if he is starting to feel palpitations, lightheadedness, dizziness, will repeat heart monitor and based on the findings may consider titrating the beta-blockers or add antiarrhythmics.  Return to clinic for follow-up in 6 months.   History of Present Illness:    Jack Turner is a 89 y.o. male who is being seen today for follow-up visit. PCP is Jack Dirks, MD. Last visit with me 04/09/2023  Here for the visit today accompanied by his daughter.  Has history of hypertension, mild mitral regurgitation, hyperlipidemia, palpitations, chronic kidney disease stage II, anemia, asthma, prostate cancer, chronic back pain, history of lumbar spinal stenosis Was evaluated for palpitations.  Heart monitor 04/09/2023 7-day study noted predominantly sinus rhythm average heart rate 72/min, rare ventricular ectopy burden, occasional ventricular ectopy burden 1.1% Multiple short runs of SVT with longest episode lasting 19 seconds with associated wide QRS appears consistent with aberrant conduction.  No sustained arrhythmias, high-grade AV blocks or pauses.  In the setting if he remains symptomatic recommended titrating up the dose of beta-blockers.  Since he remained asymptomatic he wanted to hold off on titrating up of the beta-blockers.  Reached out to's last month with symptoms of irregular heartbeat and blood pressures and heart rates readings from home were provided noting irregular heartbeat.  Mentions when he checks blood pressure at times the device alerts him for irregular heart rate since August 14.  Not on a daily basis.  He has brought various  readings over the past month some days there are notifications for irregular rate.  He remains asymptomatic.  Denies any chest pain, lightheadedness, dizziness, palpitations. No syncopal episodes.  EKG in the clinic noted sinus rhythm heart rate 68/min, PR interval 178 ms, QRS duration 120 ms, QTc 423 ms.   Past Medical History:  Diagnosis Date   Abnormal MRI, thoracic spine 08/26/2021   Anemia in chronic kidney disease 09/05/2021   Anemia of chronic disorder    Arthritis, degenerative    Asthma 01/26/2022   Ataxia    Borderline diabetes mellitus 06/19/2023   GERD (gastroesophageal reflux disease)    Hard of hearing 02/08/2023   History of prostate cancer    Hypercholesterolemia    Hypertension    Need for prophylactic vaccination and inoculation against influenza 11/02/2022   Obstructed, uropathy 06/21/2023   Overweight    Palpitations 04/09/2023   Renal failure syndrome 06/21/2023   Screening for diabetes mellitus (DM) 02/08/2023   Seasonal allergies 01/26/2022   Spinal stenosis 02/08/2023    Past Surgical History:  Procedure Laterality Date   CATARACT EXTRACTION Right    INGUINAL HERNIA REPAIR Right 08/03/2006   Performed by Dr. Archer at [facility]   PROSTATE SURGERY     TOTAL HIP ARTHROPLASTY Bilateral 02/23/1996    Current Medications: Current Meds  Medication Sig   amLODipine  (NORVASC ) 5 MG tablet TAKE 1 TABLET BY MOUTH EVERY DAY   b complex vitamins capsule Take 1 capsule by mouth every other day.   brimonidine (ALPHAGAN) 0.2 % ophthalmic solution Place 1 drop into both eyes 2 (two) times daily.   calcium -vitamin D (OSCAL WITH D) 500-5 MG-MCG tablet Take 1 tablet by mouth daily.   carvedilol  (COREG ) 6.25 MG tablet Take 1 tablet (6.25 mg total) by mouth 2 (two) times daily.   fluticasone  (FLONASE ) 50 MCG/ACT nasal spray SPRAY 2 SPRAYS INTO EACH NOSTRIL EVERY DAY   hydrochlorothiazide  (HYDRODIURIL ) 25 MG tablet Take 1 tablet (25 mg total) by mouth daily.    latanoprost (XALATAN) 0.005 % ophthalmic solution Place 1 drop into both eyes 2 (two) times daily.   Misc Natural Products (URINOZINC PO) Take 1 tablet by mouth daily. Strength unknown   montelukast  (SINGULAIR ) 10 MG tablet TAKE 1 TABLET BY MOUTH EVERY DAY   Multiple Vitamin (MULTIVITAMIN) tablet Take 1 tablet by mouth daily.   Multiple Vitamins-Minerals (EYE HEALTH PO) Take 1 tablet by mouth daily.   rosuvastatin  (CRESTOR ) 10 MG tablet Take 1 tablet (10 mg total) by mouth daily.   TRELEGY ELLIPTA  100-62.5-25 MCG/ACT AEPB TAKE 1 PUFF BY MOUTH EVERY DAY   valsartan  (DIOVAN ) 160 MG tablet TAKE 1 TABLET BY MOUTH EVERY DAY     Allergies:   Hydrochlorothiazide    Social History   Socioeconomic History   Marital status: Married    Spouse name: Not on file   Number of children: Not on file   Years of education: Not on file   Highest education level: Not on file  Occupational History   Not on file  Tobacco Use   Smoking status: Former    Current packs/day: 0.00    Types: Cigarettes    Quit date: 1992    Years since quitting: 33.7   Smokeless tobacco: Never  Vaping Use   Vaping status: Never Used  Substance and  Sexual Activity   Alcohol use: Yes    Comment: occasional/ 1 every 2-3 months   Drug use: Never   Sexual activity: Not on file  Other Topics Concern   Not on file  Social History Narrative   Not on file   Social Drivers of Health   Financial Resource Strain: Not on file  Food Insecurity: Not on file  Transportation Needs: Not on file  Physical Activity: Not on file  Stress: Not on file  Social Connections: Not on file     Family History: The patient's family history includes Other in his brother. ROS:   Please see the history of present illness.    All 14 point review of systems negative except as described per history of present illness.  EKGs/Labs/Other Studies Reviewed:    The following studies were reviewed today:   EKG:       Recent Labs: 06/03/2023:  ALT 17; Hemoglobin 9.4; Platelets 268 06/21/2023: BUN 33; Creatinine, Ser 1.13; Potassium 4.4; Sodium 137  Recent Lipid Panel    Component Value Date/Time   CHOL 207 (H) 02/08/2023 1437   TRIG 92 02/08/2023 1437   HDL 42 02/08/2023 1437   CHOLHDL 4.9 02/08/2023 1437   LDLCALC 148 (H) 02/08/2023 1437    Physical Exam:    VS:  BP (!) 172/80   Pulse 70   Ht 5' 6 (1.676 m)   Wt 162 lb 12.8 oz (73.8 kg)   SpO2 97%   BMI 26.28 kg/m     Wt Readings from Last 3 Encounters:  09/24/23 162 lb 12.8 oz (73.8 kg)  08/13/23 160 lb 6 oz (72.7 kg)  06/21/23 158 lb 8 oz (71.9 kg)     GENERAL:  Well nourished, well developed in no acute distress NECK: No JVD; No carotid bruits CARDIAC: RRR, S1 and S2 present, no murmurs, no rubs, no gallops CHEST:  Clear to auscultation without rales, wheezing or rhonchi  Extremities: No pitting pedal edema. Pulses bilaterally symmetric with radial 2+ and dorsalis pedis 2+ NEUROLOGIC:  Alert and oriented x 3  Medication Adjustments/Labs and Tests Ordered: Current medicines are reviewed at length with the patient today.  Concerns regarding medicines are outlined above.  Orders Placed This Encounter  Procedures   EKG 12-Lead   No orders of the defined types were placed in this encounter.   Signed, Jack jess Kobus, MD, MPH, [facility]. 09/24/2023 12:36 PM    [facility]

## 2023-09-24 NOTE — Assessment & Plan Note (Addendum)
 Paroxysmal runs of SVT, asymptomatic, longest episode noted 19 seconds on Zio patch monitor 7-day study March 2025. Reassured him about the findings. If he does have any symptoms associated with the irregular heartbeat notifications or if he is starting to feel palpitations, lightheadedness, dizziness, will repeat heart monitor and based on the findings may consider titrating the beta-blockers or add antiarrhythmics.

## 2023-09-24 NOTE — Assessment & Plan Note (Signed)
 Additional burden 1.1% on Zio patch 7-day study March 2025. Asymptomatic. Alert of irregular rhythm noted on his blood pressure cuff likely relates to this versus supraventricular ectopy/SVT. However he remains asymptomatic.

## 2023-09-24 NOTE — Patient Instructions (Signed)

## 2023-09-26 ENCOUNTER — Other Ambulatory Visit: Payer: Self-pay | Admitting: Oncology

## 2023-09-26 DIAGNOSIS — D631 Anemia in chronic kidney disease: Secondary | ICD-10-CM

## 2023-09-26 NOTE — Progress Notes (Unsigned)
 [facility]  [address] [place],  [place]  [address] [phone]  Clinic Day:  12/03/2022  Referring physician: Caleen Dirks, MD   HISTORY OF PRESENT ILLNESS:  The patient is a 89 y.o. male with anemia secondary to chronic renal insufficiency.  Over these past months, his hemoglobin has consistently been above 10 to where red cell shot therapy has not been necessary.  He comes in today for routine follow-up.  Since his last visit, the patient has been doing well.  He denies having increased fatigue or any overt forms of blood loss which concern him for progressive anemia.   PHYSICAL EXAM:  There were no vitals taken for this visit. Wt Readings from Last 3 Encounters:  09/24/23 162 lb 12.8 oz (73.8 kg)  08/13/23 160 lb 6 oz (72.7 kg)  06/21/23 158 lb 8 oz (71.9 kg)   There is no height or weight on file to calculate BMI. Performance status (ECOG): 1 - Symptomatic but completely ambulatory Physical Exam Constitutional:      Appearance: Normal appearance. He is not ill-appearing.  HENT:     Mouth/Throat:     Mouth: Mucous membranes are moist.     Pharynx: Oropharynx is clear. No oropharyngeal exudate or posterior oropharyngeal erythema.  Cardiovascular:     Rate and Rhythm: Normal rate and regular rhythm.     Heart sounds: No murmur heard.    No friction rub. No gallop.  Pulmonary:     Effort: Pulmonary effort is normal. No respiratory distress.     Breath sounds: Normal breath sounds. No wheezing, rhonchi or rales.  Abdominal:     General: Bowel sounds are normal. There is no distension.     Palpations: Abdomen is soft. There is no mass.     Tenderness: There is no abdominal tenderness.  Musculoskeletal:        General: No swelling.     Right lower leg: No edema.     Left lower leg: No edema.  Lymphadenopathy:     Cervical: No cervical adenopathy.     Upper Body:     Right upper body: No supraclavicular or axillary adenopathy.      Left upper body: No supraclavicular or axillary adenopathy.     Lower Body: No right inguinal adenopathy. No left inguinal adenopathy.  Skin:    General: Skin is warm.     Coloration: Skin is not jaundiced.     Findings: No lesion or rash.  Neurological:     General: No focal deficit present.     Mental Status: He is alert and oriented to person, place, and time. Mental status is at baseline.  Psychiatric:        Mood and Affect: Mood normal.        Behavior: Behavior normal.        Thought Content: Thought content normal.     LABS:      Latest Ref Rng & Units 06/03/2023   12:30 PM 12/03/2022    1:12 PM 06/01/2022   12:00 AM  CBC  WBC 3.4 - 10.8 x10E3/uL 12.8  10.2  7.4      Hemoglobin 13.0 - 17.7 g/dL 9.4  88.7  89.3      Hematocrit 37.5 - 51.0 % 29.3  33.2  31      Platelets 150 - 450 x10E3/uL 268  316  292         This result is from an external source.  Latest Ref Rng & Units 06/21/2023   11:48 AM 06/03/2023   12:30 PM 02/08/2023    2:37 PM  CMP  Glucose 70 - 99 mg/dL 82  891  92   BUN 10 - 36 mg/dL 33  80  28   Creatinine 0.76 - 1.27 mg/dL 8.86  7.11  8.85   Sodium 134 - 144 mmol/L 137  136  144   Potassium 3.5 - 5.2 mmol/L 4.4  4.6  4.9   Chloride 96 - 106 mmol/L 105  100  110   CO2 20 - 29 mmol/L 19  16  21    Calcium  8.6 - 10.2 mg/dL 8.0  7.8  8.8   Total Protein 6.0 - 8.5 g/dL  6.8  7.3   Total Bilirubin 0.0 - 1.2 mg/dL  0.3  0.4   Alkaline Phos 44 - 121 IU/L  93  98   AST 0 - 40 IU/L  29  24   ALT 0 - 44 IU/L  17  17     Latest Reference Range & Units 12/03/22 13:12  Iron 45 - 182 ug/dL 65  UIBC ug/dL 771  TIBC 749 - 549 ug/dL 706  Saturation Ratios 17.9 - 39.5 % 22  Ferritin 24 - 336 ng/mL 37    ASSESSMENT & PLAN:  Assessment/Plan:  A 89 y.o. male with anemia secondary to chronic renal insufficiency.  I am pleased as this gentleman's hemoglobin remains well above 10.  Based upon this, red cell shot therapy remains unnecessary at this time.  As his  hemoglobin has held stable and he is clinically doing well, I will see him back in 6 months for repeat clinical assessment. The patient understands all the plans discussed today and is in agreement with them.     Addaleigh Nicholls DELENA Kerns, MD

## 2023-09-27 ENCOUNTER — Telehealth: Payer: Self-pay | Admitting: Oncology

## 2023-09-27 ENCOUNTER — Inpatient Hospital Stay: Attending: Oncology | Admitting: Oncology

## 2023-09-27 ENCOUNTER — Other Ambulatory Visit: Payer: Self-pay | Admitting: Oncology

## 2023-09-27 ENCOUNTER — Inpatient Hospital Stay

## 2023-09-27 VITALS — BP 181/84 | HR 75 | Temp 98.2°F | Resp 16 | Ht 68.0 in | Wt 163.6 lb

## 2023-09-27 DIAGNOSIS — N189 Chronic kidney disease, unspecified: Secondary | ICD-10-CM | POA: Diagnosis present

## 2023-09-27 DIAGNOSIS — Z87440 Personal history of urinary (tract) infections: Secondary | ICD-10-CM | POA: Insufficient documentation

## 2023-09-27 DIAGNOSIS — D631 Anemia in chronic kidney disease: Secondary | ICD-10-CM

## 2023-09-27 DIAGNOSIS — Z79899 Other long term (current) drug therapy: Secondary | ICD-10-CM | POA: Insufficient documentation

## 2023-09-27 DIAGNOSIS — N1832 Chronic kidney disease, stage 3b: Secondary | ICD-10-CM | POA: Insufficient documentation

## 2023-09-27 LAB — CMP (CANCER CENTER ONLY)
ALT: 10 U/L (ref 0–44)
AST: 20 U/L (ref 15–41)
Albumin: 3.9 g/dL (ref 3.5–5.0)
Alkaline Phosphatase: 81 U/L (ref 38–126)
Anion gap: 12 (ref 5–15)
BUN: 41 mg/dL — ABNORMAL HIGH (ref 8–23)
CO2: 22 mmol/L (ref 22–32)
Calcium: 8.5 mg/dL — ABNORMAL LOW (ref 8.9–10.3)
Chloride: 106 mmol/L (ref 98–111)
Creatinine: 1.38 mg/dL — ABNORMAL HIGH (ref 0.61–1.24)
GFR, Estimated: 49 mL/min — ABNORMAL LOW (ref >60)
Glucose, Bld: 100 mg/dL — ABNORMAL HIGH (ref 70–99)
Potassium: 4 mmol/L (ref 3.5–5.1)
Sodium: 140 mmol/L (ref 135–145)
Total Bilirubin: 0.3 mg/dL (ref 0.0–1.2)
Total Protein: 8 g/dL (ref 6.5–8.1)

## 2023-09-27 LAB — CBC WITH DIFFERENTIAL (CANCER CENTER ONLY)
Abs Immature Granulocytes: 0.03 K/uL (ref 0.00–0.07)
Basophils Absolute: 0.1 K/uL (ref 0.0–0.1)
Basophils Relative: 1 %
Eosinophils Absolute: 0.2 K/uL (ref 0.0–0.5)
Eosinophils Relative: 3 %
HCT: 29.4 % — ABNORMAL LOW (ref 39.0–52.0)
Hemoglobin: 9.7 g/dL — ABNORMAL LOW (ref 13.0–17.0)
Immature Granulocytes: 0 %
Lymphocytes Relative: 19 %
Lymphs Abs: 1.5 K/uL (ref 0.7–4.0)
MCH: 29.8 pg (ref 26.0–34.0)
MCHC: 33 g/dL (ref 30.0–36.0)
MCV: 90.2 fL (ref 80.0–100.0)
Monocytes Absolute: 0.7 K/uL (ref 0.1–1.0)
Monocytes Relative: 8 %
Neutro Abs: 5.6 K/uL (ref 1.7–7.7)
Neutrophils Relative %: 69 %
Platelet Count: 261 K/uL (ref 150–400)
RBC: 3.26 MIL/uL — ABNORMAL LOW (ref 4.22–5.81)
RDW: 14.2 % (ref 11.5–15.5)
WBC Count: 8.1 K/uL (ref 4.0–10.5)
nRBC: 0 % (ref 0.0–0.2)

## 2023-09-27 LAB — IRON AND TIBC
Iron: 48 ug/dL (ref 45–182)
Saturation Ratios: 19 % (ref 17.9–39.5)
TIBC: 251 ug/dL (ref 250–450)
UIBC: 203 ug/dL

## 2023-09-27 NOTE — Telephone Encounter (Signed)
 Patient has been scheduled for follow-up visit per 09/27/23 LOS.  Pt given an appt calendar with date and time.

## 2023-09-28 ENCOUNTER — Encounter: Payer: Self-pay | Admitting: Oncology

## 2023-09-28 ENCOUNTER — Telehealth: Payer: Self-pay | Admitting: Oncology

## 2023-09-28 ENCOUNTER — Telehealth: Payer: Self-pay

## 2023-09-28 LAB — FERRITIN: Ferritin: 54 ng/mL (ref 24–336)

## 2023-09-28 NOTE — Telephone Encounter (Signed)
 Dr Ezzard reviewed labs, states pt needs Retacrit  injection, & CBC's monthly.   Latest Reference Range & Units 09/27/23 15:47  Iron 45 - 182 ug/dL 48  UIBC ug/dL 796  TIBC 749 - 549 ug/dL 748  Saturation Ratios 17.9 - 39.5 % 19  Ferritin 24 - 336 ng/mL 54

## 2023-09-28 NOTE — Telephone Encounter (Signed)
 09/28/23 Spoke with wife and scheduled injection appt.

## 2023-09-30 ENCOUNTER — Inpatient Hospital Stay

## 2023-09-30 VITALS — BP 156/79 | HR 74 | Temp 97.9°F | Resp 18

## 2023-09-30 DIAGNOSIS — D631 Anemia in chronic kidney disease: Secondary | ICD-10-CM

## 2023-09-30 DIAGNOSIS — N189 Chronic kidney disease, unspecified: Secondary | ICD-10-CM | POA: Diagnosis not present

## 2023-09-30 MED ORDER — EPOETIN ALFA-EPBX 20000 UNIT/ML IJ SOLN
20000.0000 [IU] | Freq: Once | INTRAMUSCULAR | Status: AC
  Administered 2023-09-30: 20000 [IU] via SUBCUTANEOUS
  Filled 2023-09-30: qty 1

## 2023-09-30 NOTE — Patient Instructions (Signed)

## 2023-10-09 ENCOUNTER — Other Ambulatory Visit: Payer: Self-pay | Admitting: Internal Medicine

## 2023-10-21 ENCOUNTER — Other Ambulatory Visit: Payer: Self-pay | Admitting: Internal Medicine

## 2023-10-25 ENCOUNTER — Other Ambulatory Visit

## 2023-10-28 ENCOUNTER — Ambulatory Visit

## 2023-10-28 ENCOUNTER — Other Ambulatory Visit

## 2023-10-29 ENCOUNTER — Inpatient Hospital Stay: Attending: Oncology

## 2023-10-29 ENCOUNTER — Inpatient Hospital Stay

## 2023-10-29 ENCOUNTER — Ambulatory Visit: Admitting: Internal Medicine

## 2023-10-29 DIAGNOSIS — N189 Chronic kidney disease, unspecified: Secondary | ICD-10-CM | POA: Diagnosis present

## 2023-10-29 DIAGNOSIS — D631 Anemia in chronic kidney disease: Secondary | ICD-10-CM | POA: Insufficient documentation

## 2023-10-29 LAB — CBC WITH DIFFERENTIAL (CANCER CENTER ONLY)
Abs Immature Granulocytes: 0.03 K/uL (ref 0.00–0.07)
Basophils Absolute: 0.1 K/uL (ref 0.0–0.1)
Basophils Relative: 1 %
Eosinophils Absolute: 0.2 K/uL (ref 0.0–0.5)
Eosinophils Relative: 2 %
HCT: 30.5 % — ABNORMAL LOW (ref 39.0–52.0)
Hemoglobin: 10.1 g/dL — ABNORMAL LOW (ref 13.0–17.0)
Immature Granulocytes: 0 %
Lymphocytes Relative: 17 %
Lymphs Abs: 1.3 K/uL (ref 0.7–4.0)
MCH: 29.7 pg (ref 26.0–34.0)
MCHC: 33.1 g/dL (ref 30.0–36.0)
MCV: 89.7 fL (ref 80.0–100.0)
Monocytes Absolute: 0.7 K/uL (ref 0.1–1.0)
Monocytes Relative: 8 %
Neutro Abs: 5.8 K/uL (ref 1.7–7.7)
Neutrophils Relative %: 72 %
Platelet Count: 260 K/uL (ref 150–400)
RBC: 3.4 MIL/uL — ABNORMAL LOW (ref 4.22–5.81)
RDW: 13.6 % (ref 11.5–15.5)
WBC Count: 8 K/uL (ref 4.0–10.5)
nRBC: 0 % (ref 0.0–0.2)

## 2023-10-29 NOTE — Progress Notes (Unsigned)
 Hgb 10.1, No injection today

## 2023-11-15 ENCOUNTER — Encounter: Payer: Self-pay | Admitting: Internal Medicine

## 2023-11-15 ENCOUNTER — Ambulatory Visit: Admitting: Internal Medicine

## 2023-11-15 VITALS — BP 148/84 | HR 74 | Temp 97.8°F | Resp 18 | Ht 66.0 in | Wt 164.0 lb

## 2023-11-15 DIAGNOSIS — Z23 Encounter for immunization: Secondary | ICD-10-CM | POA: Diagnosis not present

## 2023-11-15 DIAGNOSIS — D638 Anemia in other chronic diseases classified elsewhere: Secondary | ICD-10-CM | POA: Diagnosis not present

## 2023-11-15 DIAGNOSIS — I10 Essential (primary) hypertension: Secondary | ICD-10-CM | POA: Diagnosis not present

## 2023-11-15 DIAGNOSIS — E7800 Pure hypercholesterolemia, unspecified: Secondary | ICD-10-CM

## 2023-11-15 DIAGNOSIS — R7303 Prediabetes: Secondary | ICD-10-CM

## 2023-11-15 DIAGNOSIS — J4520 Mild intermittent asthma, uncomplicated: Secondary | ICD-10-CM | POA: Diagnosis not present

## 2023-11-15 DIAGNOSIS — N182 Chronic kidney disease, stage 2 (mild): Secondary | ICD-10-CM

## 2023-11-15 DIAGNOSIS — D631 Anemia in chronic kidney disease: Secondary | ICD-10-CM

## 2023-11-15 MED ORDER — TRELEGY ELLIPTA 100-62.5-25 MCG/ACT IN AEPB: 1.0000 | INHALATION_SPRAY | Freq: Every day | RESPIRATORY_TRACT | 11 refills | Status: AC

## 2023-11-15 MED ORDER — AMLODIPINE BESYLATE 5 MG PO TABS: 5.0000 mg | ORAL_TABLET | Freq: Every day | ORAL | 2 refills | Status: AC

## 2023-11-15 NOTE — Assessment & Plan Note (Signed)
 He takes rosuvastatin  10 mg and will continue to monitor his cholesterol.

## 2023-11-15 NOTE — Assessment & Plan Note (Signed)
 His blood pressure is controlled.

## 2023-11-15 NOTE — Assessment & Plan Note (Signed)
 His asthma symptoms are controlled and he need trelegy 1 puff every day refill.

## 2023-11-15 NOTE — Assessment & Plan Note (Signed)
 He has borderline diabetes and his last hemoglobin A1c was 6.0.  I will repeat labs on next visit.

## 2023-11-15 NOTE — Progress Notes (Signed)
 Office Visit  Subjective   Patient ID: Alessandro Griep   DOB: 1931/04/09   Age: 89 y.o.   MRN: [id number]   Chief Complaint Chief Complaint  Patient presents with   Follow-up    3 month follow up     History of Present Illness 89 years old male is here for follow up with his daughter.  He brought his blood pressure reading from home and his blood pressure has been around 123/67 but is slightly high in our office. He denies any side effect. He takes amlodipine  5 mg daily, valsartan  160 mg daily and coreg  6.25 mg twice a day and hydrochlorothiazide  daily.  His GFR was  49 in September but has been greater than 60 before.  I will repeat labs on next visit.    He says that he saw urologist and he was told that he still has infection and he was given antibiotic.  He will see them as a follow-up in 6 months. He has prostate cancer and seeds were implant.   He follow Dr. Ezzard for anemia and his hemoglobin  Was 9.4 and he was given Epogen  injection 2 months ago.  His hemoglobin was 10.1 in October so he was told that he do not need Epogen  this month.  He follows with Dr. Ezzard every 6 months.    He has asthma and his asthma is much better, he use telergy and take Singulair  10 mg daily.    He also has glaucoma and follow with eye doctor.    He has borderline diabetes and his last Hb A1c was 6.0%  In January 2025.   He also has spinal stenosis and he can not stand longer because of pain. He takes tylenol  arthritis.   Past Medical History Past Medical History:  Diagnosis Date   Abnormal MRI, thoracic spine 08/26/2021   Anemia in chronic kidney disease 09/05/2021   Anemia of chronic disorder    Arthritis, degenerative    Asthma 01/26/2022   Ataxia    Borderline diabetes mellitus 06/19/2023   GERD (gastroesophageal reflux disease)    Hard of hearing 02/08/2023   History of prostate cancer    Hypercholesterolemia    Hypertension    Need for prophylactic vaccination and inoculation  against influenza 11/02/2022   Obstructed, uropathy 06/21/2023   Overweight    Palpitations 04/09/2023   Renal failure syndrome 06/21/2023   Screening for diabetes mellitus (DM) 02/08/2023   Seasonal allergies 01/26/2022   Spinal stenosis 02/08/2023     Allergies Allergies  Allergen Reactions   Hydrochlorothiazide  Other (See Comments)    Caused renal insufficiency     Review of Systems Review of Systems  Constitutional: Negative.   HENT: Negative.    Respiratory: Negative.    Cardiovascular: Negative.   Gastrointestinal: Negative.   Neurological: Negative.        Objective:    Vitals BP (!) 148/84   Pulse 74   Temp 97.8 F (36.6 C)   Resp 18   Ht 5' 6 (1.676 m)   Wt 164 lb (74.4 kg)   SpO2 97%   BMI 26.47 kg/m    Physical Examination Physical Exam Constitutional:      Appearance: Normal appearance.  HENT:     Head: Normocephalic and atraumatic.  Eyes:     Extraocular Movements: Extraocular movements intact.     Pupils: Pupils are equal, round, and reactive to light.  Cardiovascular:     Rate and Rhythm: Normal rate  and regular rhythm.     Heart sounds: Normal heart sounds.  Pulmonary:     Effort: Pulmonary effort is normal.     Breath sounds: Normal breath sounds.  Abdominal:     General: Bowel sounds are normal.     Palpations: Abdomen is soft.  Neurological:     General: No focal deficit present.     Mental Status: He is alert and oriented to person, place, and time.        Assessment & Plan:   Hypertension  His blood pressure is controlled.  Asthma   His asthma symptoms are controlled and he need trelegy 1 puff every day refill.  Anemia of chronic disorder   [facility] and [facility] will monitor his hemoglobin monthly.  Borderline diabetes mellitus   He has borderline diabetes and his last hemoglobin A1c was 6.0.  I will repeat labs on next visit.  Hypercholesterolemia   He takes rosuvastatin  10 mg and  will continue to monitor his cholesterol.    Return in about 3 months (around 02/15/2024).   Roetta Dare, MD

## 2023-11-15 NOTE — Assessment & Plan Note (Signed)
 He will [facility] to [facility] and they will monitor his hemoglobin monthly.

## 2023-11-22 ENCOUNTER — Other Ambulatory Visit

## 2023-11-25 ENCOUNTER — Inpatient Hospital Stay

## 2023-11-25 ENCOUNTER — Inpatient Hospital Stay: Attending: Oncology

## 2023-11-25 VITALS — BP 148/78 | HR 73 | Temp 98.1°F | Resp 18

## 2023-11-25 DIAGNOSIS — D631 Anemia in chronic kidney disease: Secondary | ICD-10-CM

## 2023-11-25 LAB — CBC WITH DIFFERENTIAL (CANCER CENTER ONLY)
Abs Immature Granulocytes: 0.03 K/uL (ref 0.00–0.07)
Basophils Absolute: 0.1 K/uL (ref 0.0–0.1)
Basophils Relative: 1 %
Eosinophils Absolute: 0.2 K/uL (ref 0.0–0.5)
Eosinophils Relative: 2 %
HCT: 28.3 % — ABNORMAL LOW (ref 39.0–52.0)
Hemoglobin: 9.4 g/dL — ABNORMAL LOW (ref 13.0–17.0)
Immature Granulocytes: 0 %
Lymphocytes Relative: 18 %
Lymphs Abs: 1.3 K/uL (ref 0.7–4.0)
MCH: 29.6 pg (ref 26.0–34.0)
MCHC: 33.2 g/dL (ref 30.0–36.0)
MCV: 89 fL (ref 80.0–100.0)
Monocytes Absolute: 0.6 K/uL (ref 0.1–1.0)
Monocytes Relative: 9 %
Neutro Abs: 5.1 K/uL (ref 1.7–7.7)
Neutrophils Relative %: 70 %
Platelet Count: 239 K/uL (ref 150–400)
RBC: 3.18 MIL/uL — ABNORMAL LOW (ref 4.22–5.81)
RDW: 14.1 % (ref 11.5–15.5)
WBC Count: 7.2 K/uL (ref 4.0–10.5)
nRBC: 0 % (ref 0.0–0.2)

## 2023-11-25 MED ORDER — EPOETIN ALFA-EPBX 20000 UNIT/ML IJ SOLN
20000.0000 [IU] | Freq: Once | INTRAMUSCULAR | Status: AC
  Administered 2023-11-25: 20000 [IU] via SUBCUTANEOUS
  Filled 2023-11-25: qty 1

## 2023-11-25 NOTE — Patient Instructions (Signed)

## 2023-12-20 ENCOUNTER — Other Ambulatory Visit

## 2023-12-23 ENCOUNTER — Inpatient Hospital Stay: Attending: Oncology

## 2023-12-23 ENCOUNTER — Inpatient Hospital Stay

## 2023-12-23 VITALS — BP 159/84 | HR 72 | Temp 98.2°F | Resp 18

## 2023-12-23 DIAGNOSIS — N182 Chronic kidney disease, stage 2 (mild): Secondary | ICD-10-CM | POA: Diagnosis present

## 2023-12-23 DIAGNOSIS — D631 Anemia in chronic kidney disease: Secondary | ICD-10-CM

## 2023-12-23 LAB — CBC WITH DIFFERENTIAL (CANCER CENTER ONLY)
Abs Immature Granulocytes: 0.02 K/uL (ref 0.00–0.07)
Basophils Absolute: 0.1 K/uL (ref 0.0–0.1)
Basophils Relative: 1 %
Eosinophils Absolute: 0.2 K/uL (ref 0.0–0.5)
Eosinophils Relative: 3 %
HCT: 29.5 % — ABNORMAL LOW (ref 39.0–52.0)
Hemoglobin: 9.5 g/dL — ABNORMAL LOW (ref 13.0–17.0)
Immature Granulocytes: 0 %
Lymphocytes Relative: 18 %
Lymphs Abs: 1.4 K/uL (ref 0.7–4.0)
MCH: 29.1 pg (ref 26.0–34.0)
MCHC: 32.2 g/dL (ref 30.0–36.0)
MCV: 90.5 fL (ref 80.0–100.0)
Monocytes Absolute: 0.7 K/uL (ref 0.1–1.0)
Monocytes Relative: 10 %
Neutro Abs: 5 K/uL (ref 1.7–7.7)
Neutrophils Relative %: 68 %
Platelet Count: 249 K/uL (ref 150–400)
RBC: 3.26 MIL/uL — ABNORMAL LOW (ref 4.22–5.81)
RDW: 14.4 % (ref 11.5–15.5)
WBC Count: 7.4 K/uL (ref 4.0–10.5)
nRBC: 0 % (ref 0.0–0.2)

## 2023-12-23 LAB — IRON AND TIBC
Iron: 35 ug/dL — ABNORMAL LOW (ref 45–182)
Saturation Ratios: 14 % — ABNORMAL LOW (ref 17.9–39.5)
TIBC: 253 ug/dL (ref 250–450)
UIBC: 218 ug/dL

## 2023-12-23 LAB — FERRITIN: Ferritin: 79 ng/mL (ref 24–336)

## 2023-12-23 MED ORDER — EPOETIN ALFA-EPBX 20000 UNIT/ML IJ SOLN
20000.0000 [IU] | Freq: Once | INTRAMUSCULAR | Status: AC
  Administered 2023-12-23: 20000 [IU] via SUBCUTANEOUS
  Filled 2023-12-23: qty 1

## 2023-12-23 NOTE — Patient Instructions (Signed)

## 2024-01-16 ENCOUNTER — Other Ambulatory Visit: Payer: Self-pay | Admitting: Internal Medicine

## 2024-01-26 NOTE — Progress Notes (Signed)
 " [facility] [address] [place],  [place]  [address] [phone]  Clinic Day:  01/27/2024  Referring physician: Caleen Dirks, MD   HISTORY OF PRESENT ILLNESS:  The patient is a 89 y.o. male with anemia secondary to chronic renal insufficiency.  Over these past months, his hemoglobin has consistently been above 10 to where red cell shot therapy has not been necessary.  He comes in today for routine follow-up.  Since his last visit, the patient has been doing fairly well.  He denies having increased fatigue or any overt forms of blood loss which concern him for progressive anemia.    PHYSICAL EXAM:  Blood pressure (!) 155/80, pulse 72, temperature 98.3 F (36.8 C), temperature source Oral, resp. rate 16, height 5' 6 (1.676 m), weight 161 lb 11.2 oz (73.3 kg), SpO2 97%. Wt Readings from Last 3 Encounters:  01/27/24 161 lb 11.2 oz (73.3 kg)  11/15/23 164 lb (74.4 kg)  09/27/23 163 lb 9.6 oz (74.2 kg)   Body mass index is 26.1 kg/m. Performance status (ECOG): 1 - Symptomatic but completely ambulatory Physical Exam Constitutional:      Appearance: Normal appearance. He is not ill-appearing.  HENT:     Mouth/Throat:     Mouth: Mucous membranes are moist.     Pharynx: Oropharynx is clear. No oropharyngeal exudate or posterior oropharyngeal erythema.  Cardiovascular:     Rate and Rhythm: Normal rate and regular rhythm.     Heart sounds: No murmur heard.    No friction rub. No gallop.  Pulmonary:     Effort: Pulmonary effort is normal. No respiratory distress.     Breath sounds: Normal breath sounds. No wheezing, rhonchi or rales.  Abdominal:     General: Bowel sounds are normal. There is no distension.     Palpations: Abdomen is soft. There is no mass.     Tenderness: There is no abdominal tenderness.  Musculoskeletal:        General: No swelling.     Right lower leg: No edema.     Left lower leg: No edema.  Lymphadenopathy:     Cervical: No  cervical adenopathy.     Upper Body:     Right upper body: No supraclavicular or axillary adenopathy.     Left upper body: No supraclavicular or axillary adenopathy.     Lower Body: No right inguinal adenopathy. No left inguinal adenopathy.  Skin:    General: Skin is warm.     Coloration: Skin is not jaundiced.     Findings: No lesion or rash.  Neurological:     General: No focal deficit present.     Mental Status: He is alert and oriented to person, place, and time. Mental status is at baseline.  Psychiatric:        Mood and Affect: Mood normal.        Behavior: Behavior normal.        Thought Content: Thought content normal.     LABS:      Latest Ref Rng & Units 01/27/2024    2:49 PM 12/23/2023    2:39 PM 11/25/2023    2:26 PM  CBC  WBC 4.0 - 10.5 K/uL 8.3  7.4  7.2   Hemoglobin 13.0 - 17.0 g/dL 9.6  9.5  9.4   Hematocrit 39.0 - 52.0 % 29.8  29.5  28.3   Platelets 150 - 400 K/uL 273  249  239  Latest Ref Rng & Units 01/27/2024    2:49 PM 09/27/2023    3:47 PM 06/21/2023   11:48 AM  CMP  Glucose 70 - 99 mg/dL 887  899  82   BUN 8 - 23 mg/dL 37  41  33   Creatinine 0.61 - 1.24 mg/dL 8.61  8.61  8.86   Sodium 135 - 145 mmol/L 140  140  137   Potassium 3.5 - 5.1 mmol/L 4.0  4.0  4.4   Chloride 98 - 111 mmol/L 107  106  105   CO2 22 - 32 mmol/L 23  22  19    Calcium  8.9 - 10.3 mg/dL 8.7  8.5  8.0   Total Protein 6.5 - 8.1 g/dL 7.5  8.0    Total Bilirubin 0.0 - 1.2 mg/dL 0.4  0.3    Alkaline Phos 38 - 126 U/L 90  81    AST 15 - 41 U/L 29  20    ALT 0 - 44 U/L <5  10      Latest Reference Range & Units   Iron 45 - 182 ug/dL 38 (L)  UIBC ug/dL 798  TIBC 749 - 549 ug/dL 761 (L)  Saturation Ratios 17.9 - 39.5 % 16 (L)  Ferritin 24 - 336 ng/mL 89  (L): Data is abnormally low  ASSESSMENT & PLAN:  Assessment/Plan:  A 89 y.o. male with anemia secondary to chronic renal insufficiency.  This gentleman's hemoglobin is slightly below 10 today.  His iron studies are actually  more consistent with anemia secondary to chronic disease, more so than iron deficiency anemia.  Based upon this, he will get back to receiving Retacrit  20,000 units once per month.  Moving forward, his CBC will be followed on a monthly basis, with Retacrit  injections to be given anytime his hemoglobin falls is below 10.  I will see him back in 4 months for repeat clinical assessment.  The patient understands all the plans discussed today and is in agreement with them.   Jack Turner DELENA Kerns, MD       "

## 2024-01-27 ENCOUNTER — Telehealth: Payer: Self-pay | Admitting: Oncology

## 2024-01-27 ENCOUNTER — Inpatient Hospital Stay: Attending: Oncology

## 2024-01-27 ENCOUNTER — Inpatient Hospital Stay

## 2024-01-27 ENCOUNTER — Inpatient Hospital Stay (HOSPITAL_BASED_OUTPATIENT_CLINIC_OR_DEPARTMENT_OTHER): Admitting: Oncology

## 2024-01-27 ENCOUNTER — Other Ambulatory Visit: Payer: Self-pay | Admitting: Oncology

## 2024-01-27 VITALS — BP 155/80 | HR 72 | Temp 98.3°F | Resp 16 | Ht 66.0 in | Wt 161.7 lb

## 2024-01-27 DIAGNOSIS — D631 Anemia in chronic kidney disease: Secondary | ICD-10-CM

## 2024-01-27 DIAGNOSIS — N189 Chronic kidney disease, unspecified: Secondary | ICD-10-CM | POA: Diagnosis not present

## 2024-01-27 LAB — CMP (CANCER CENTER ONLY)
ALT: <5 U/L (ref 0–44)
AST: 29 U/L (ref 15–41)
Albumin: 3.7 g/dL (ref 3.5–5.0)
Alkaline Phosphatase: 90 U/L (ref 38–126)
Anion gap: 10 (ref 5–15)
BUN: 37 mg/dL — ABNORMAL HIGH (ref 8–23)
CO2: 23 mmol/L (ref 22–32)
Calcium: 8.7 mg/dL — ABNORMAL LOW (ref 8.9–10.3)
Chloride: 107 mmol/L (ref 98–111)
Creatinine: 1.38 mg/dL — ABNORMAL HIGH (ref 0.61–1.24)
GFR, Estimated: 49 mL/min — ABNORMAL LOW
Glucose, Bld: 112 mg/dL — ABNORMAL HIGH (ref 70–99)
Potassium: 4 mmol/L (ref 3.5–5.1)
Sodium: 140 mmol/L (ref 135–145)
Total Bilirubin: 0.4 mg/dL (ref 0.0–1.2)
Total Protein: 7.5 g/dL (ref 6.5–8.1)

## 2024-01-27 LAB — CBC WITH DIFFERENTIAL (CANCER CENTER ONLY)
Abs Immature Granulocytes: 0.04 K/uL (ref 0.00–0.07)
Basophils Absolute: 0.1 K/uL (ref 0.0–0.1)
Basophils Relative: 1 %
Eosinophils Absolute: 0.3 K/uL (ref 0.0–0.5)
Eosinophils Relative: 4 %
HCT: 29.8 % — ABNORMAL LOW (ref 39.0–52.0)
Hemoglobin: 9.6 g/dL — ABNORMAL LOW (ref 13.0–17.0)
Immature Granulocytes: 1 %
Lymphocytes Relative: 18 %
Lymphs Abs: 1.5 K/uL (ref 0.7–4.0)
MCH: 28.8 pg (ref 26.0–34.0)
MCHC: 32.2 g/dL (ref 30.0–36.0)
MCV: 89.5 fL (ref 80.0–100.0)
Monocytes Absolute: 0.7 K/uL (ref 0.1–1.0)
Monocytes Relative: 9 %
Neutro Abs: 5.7 K/uL (ref 1.7–7.7)
Neutrophils Relative %: 67 %
Platelet Count: 273 K/uL (ref 150–400)
RBC: 3.33 MIL/uL — ABNORMAL LOW (ref 4.22–5.81)
RDW: 14.3 % (ref 11.5–15.5)
WBC Count: 8.3 K/uL (ref 4.0–10.5)
nRBC: 0 % (ref 0.0–0.2)

## 2024-01-27 NOTE — Telephone Encounter (Signed)
 Patient has been scheduled for follow-up visit per 01/27/2024 LOS.  Pt given an appt calendar with date and time.

## 2024-01-27 NOTE — Progress Notes (Signed)
 1530 DR. LEWIS WANTED TO HOLD THE SHOT TODAY UNTIL HE GETS THE IRON STUDIES BACK.

## 2024-01-31 ENCOUNTER — Inpatient Hospital Stay

## 2024-01-31 ENCOUNTER — Other Ambulatory Visit: Payer: Self-pay

## 2024-01-31 DIAGNOSIS — D631 Anemia in chronic kidney disease: Secondary | ICD-10-CM

## 2024-01-31 LAB — IRON AND TIBC
Iron: 38 ug/dL — ABNORMAL LOW (ref 45–182)
Saturation Ratios: 16 % — ABNORMAL LOW (ref 17.9–39.5)
TIBC: 238 ug/dL — ABNORMAL LOW (ref 250–450)
UIBC: 201 ug/dL

## 2024-01-31 LAB — FERRITIN: Ferritin: 89 ng/mL (ref 24–336)

## 2024-02-03 ENCOUNTER — Other Ambulatory Visit: Payer: Self-pay | Admitting: Oncology

## 2024-02-03 ENCOUNTER — Encounter: Payer: Self-pay | Admitting: Oncology

## 2024-02-03 ENCOUNTER — Telehealth: Payer: Self-pay

## 2024-02-03 DIAGNOSIS — D631 Anemia in chronic kidney disease: Secondary | ICD-10-CM

## 2024-02-03 NOTE — Telephone Encounter (Signed)
 Patients wife Doyal called to follow up on lab results.Patient to have monthly lab appt, injections to  resume every 28 days and appt and labs with Dr Ezzard in April per Dr Ezzard. Dianna voiced understanding. Damaris in sch will reach out and sch appts.

## 2024-02-10 ENCOUNTER — Encounter: Payer: Self-pay | Admitting: Oncology

## 2024-02-10 ENCOUNTER — Inpatient Hospital Stay

## 2024-02-10 VITALS — BP 156/80 | HR 72 | Temp 98.3°F | Resp 18

## 2024-02-10 DIAGNOSIS — D631 Anemia in chronic kidney disease: Secondary | ICD-10-CM

## 2024-02-10 MED ORDER — EPOETIN ALFA-EPBX 20000 UNIT/ML IJ SOLN
20000.0000 [IU] | Freq: Once | INTRAMUSCULAR | Status: AC
  Administered 2024-02-10: 20000 [IU] via SUBCUTANEOUS
  Filled 2024-02-10: qty 1

## 2024-02-10 NOTE — Patient Instructions (Signed)

## 2024-02-14 ENCOUNTER — Ambulatory Visit: Admitting: Internal Medicine

## 2024-02-14 ENCOUNTER — Encounter: Payer: Self-pay | Admitting: Oncology

## 2024-02-16 ENCOUNTER — Ambulatory Visit: Admitting: Internal Medicine

## 2024-03-06 ENCOUNTER — Ambulatory Visit: Admitting: Internal Medicine

## 2024-03-09 ENCOUNTER — Inpatient Hospital Stay

## 2024-03-24 ENCOUNTER — Ambulatory Visit

## 2024-04-06 ENCOUNTER — Inpatient Hospital Stay

## 2024-04-27 ENCOUNTER — Inpatient Hospital Stay

## 2024-04-27 ENCOUNTER — Inpatient Hospital Stay: Admitting: Oncology

## 2024-05-04 ENCOUNTER — Inpatient Hospital Stay: Admitting: Oncology

## 2024-05-04 ENCOUNTER — Inpatient Hospital Stay
